# Patient Record
Sex: Male | Born: 1960 | Race: White | Hispanic: No | Marital: Single | Smoking: Current every day smoker
Health system: Southern US, Community
[De-identification: ages and names within clinical notes are randomized; demographics above are authoritative.]

## PROBLEM LIST (undated history)

## (undated) DIAGNOSIS — T4145XA Adverse effect of unspecified anesthetic, initial encounter: Secondary | ICD-10-CM

## (undated) DIAGNOSIS — T8859XA Other complications of anesthesia, initial encounter: Secondary | ICD-10-CM

## (undated) DIAGNOSIS — I10 Essential (primary) hypertension: Secondary | ICD-10-CM

## (undated) DIAGNOSIS — I8390 Asymptomatic varicose veins of unspecified lower extremity: Secondary | ICD-10-CM

## (undated) DIAGNOSIS — K219 Gastro-esophageal reflux disease without esophagitis: Secondary | ICD-10-CM

## (undated) DIAGNOSIS — G894 Chronic pain syndrome: Secondary | ICD-10-CM

## (undated) DIAGNOSIS — F410 Panic disorder [episodic paroxysmal anxiety] without agoraphobia: Secondary | ICD-10-CM

## (undated) DIAGNOSIS — K760 Fatty (change of) liver, not elsewhere classified: Secondary | ICD-10-CM

## (undated) HISTORY — PX: BACK SURGERY: SHX140

## (undated) HISTORY — PX: HARVEST BONE GRAFT: SHX377

## (undated) HISTORY — PX: FRACTURE SURGERY: SHX138

## (undated) HISTORY — PX: NASAL SINUS SURGERY: SHX719

---

## 1998-12-23 HISTORY — PX: MANDIBLE FRACTURE SURGERY: SHX706

## 2004-07-29 ENCOUNTER — Other Ambulatory Visit: Payer: Self-pay

## 2005-01-04 ENCOUNTER — Emergency Department: Payer: Self-pay | Admitting: Internal Medicine

## 2005-01-24 ENCOUNTER — Emergency Department: Payer: Self-pay | Admitting: Emergency Medicine

## 2005-09-22 ENCOUNTER — Emergency Department: Payer: Self-pay | Admitting: Emergency Medicine

## 2005-09-22 ENCOUNTER — Other Ambulatory Visit: Payer: Self-pay

## 2009-02-14 ENCOUNTER — Ambulatory Visit: Payer: Self-pay | Admitting: Internal Medicine

## 2009-02-16 ENCOUNTER — Observation Stay: Payer: Self-pay | Admitting: Otolaryngology

## 2009-09-01 ENCOUNTER — Emergency Department: Payer: Self-pay | Admitting: Emergency Medicine

## 2010-06-27 ENCOUNTER — Emergency Department: Payer: Self-pay | Admitting: Emergency Medicine

## 2010-07-13 ENCOUNTER — Ambulatory Visit: Payer: Self-pay | Admitting: Internal Medicine

## 2010-09-19 ENCOUNTER — Emergency Department: Payer: Self-pay | Admitting: Emergency Medicine

## 2010-10-08 ENCOUNTER — Inpatient Hospital Stay: Payer: Self-pay | Admitting: Internal Medicine

## 2010-12-19 ENCOUNTER — Emergency Department: Payer: Self-pay | Admitting: Unknown Physician Specialty

## 2011-05-29 ENCOUNTER — Emergency Department: Payer: Self-pay | Admitting: Internal Medicine

## 2011-08-23 ENCOUNTER — Emergency Department: Payer: Self-pay | Admitting: Emergency Medicine

## 2012-03-30 ENCOUNTER — Emergency Department: Payer: Self-pay | Admitting: Emergency Medicine

## 2012-04-02 LAB — WOUND AEROBIC CULTURE

## 2012-08-17 ENCOUNTER — Emergency Department: Payer: Self-pay | Admitting: Emergency Medicine

## 2012-08-17 LAB — COMPREHENSIVE METABOLIC PANEL
Anion Gap: 4 — ABNORMAL LOW (ref 7–16)
Calcium, Total: 9.4 mg/dL (ref 8.5–10.1)
Chloride: 110 mmol/L — ABNORMAL HIGH (ref 98–107)
Co2: 28 mmol/L (ref 21–32)
EGFR (African American): >60
EGFR (Non-African Amer.): >60
Osmolality: 280 (ref 275–301)
Potassium: 4.2 mmol/L (ref 3.5–5.1)
SGOT(AST): 47 U/L — ABNORMAL HIGH (ref 15–37)
SGPT (ALT): 51 U/L (ref 12–78)
Total Protein: 8 g/dL (ref 6.4–8.2)

## 2012-08-17 LAB — URINALYSIS, COMPLETE
Bilirubin,UR: NEGATIVE
Blood: NEGATIVE
Ketone: NEGATIVE
Nitrite: NEGATIVE
Ph: 5 (ref 4.5–8.0)
Squamous Epithelial: NONE SEEN

## 2012-08-17 LAB — CBC
MCH: 32.4 pg (ref 26.0–34.0)
MCV: 93 fL (ref 80–100)
Platelet: 232 10*3/uL (ref 150–440)
RDW: 12.9 % (ref 11.5–14.5)
WBC: 9.6 10*3/uL (ref 3.8–10.6)

## 2013-01-15 ENCOUNTER — Ambulatory Visit: Payer: Self-pay | Admitting: Neurology

## 2013-04-03 IMAGING — CT CT MAXILLOFACIAL WITH CONTRAST
1 series · 15 of 30 positions shown, 19 images · non-contrast
Comparison: none

REASON FOR EXAM: r/o abscess
COMMENTS:

[Series 3: soft tissue · axial · 0.34mm/px · z∈[-182,-42]mm · 15 of 51 slices shown, 19 images]
[im 2/51  brain]
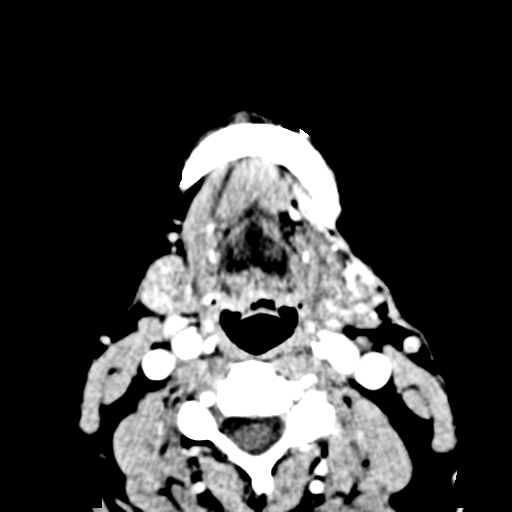
[im 2/51  bone]
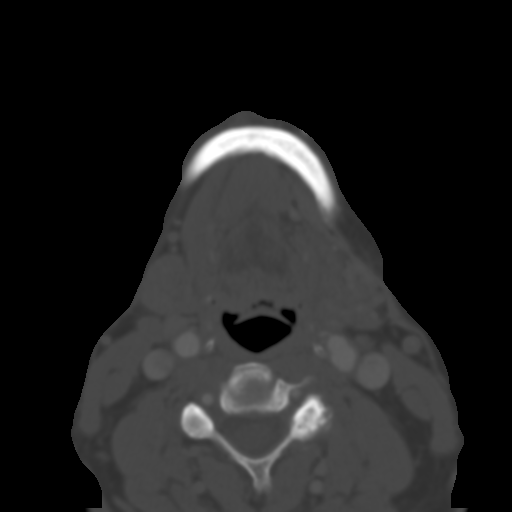
[im 6/51  bone]
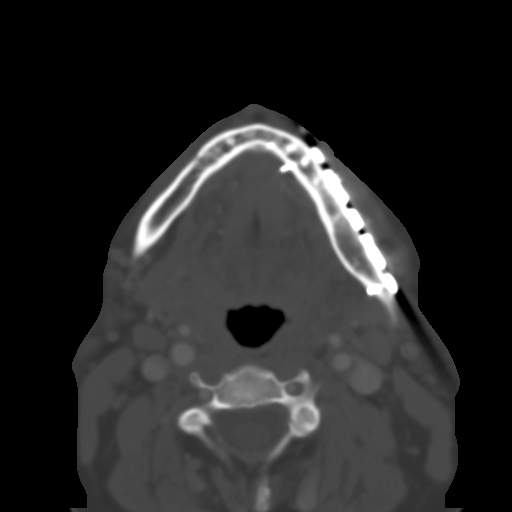
[im 9/51  bone]
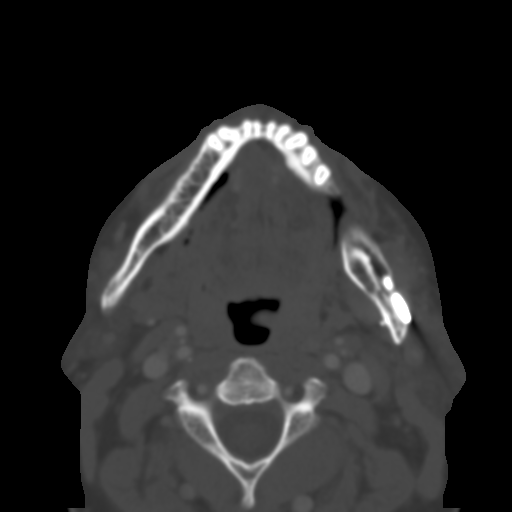
[im 13/51  bone]
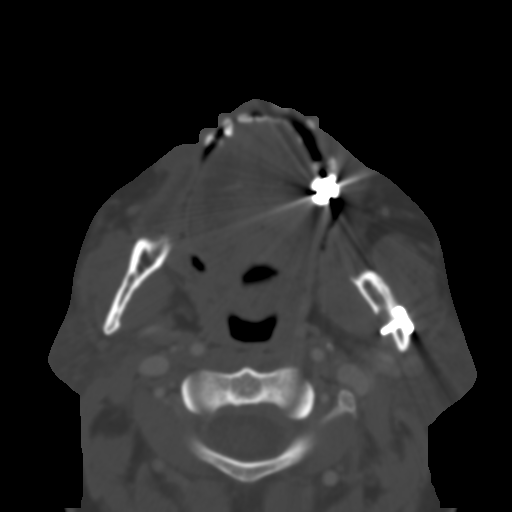
[im 16/51  brain]
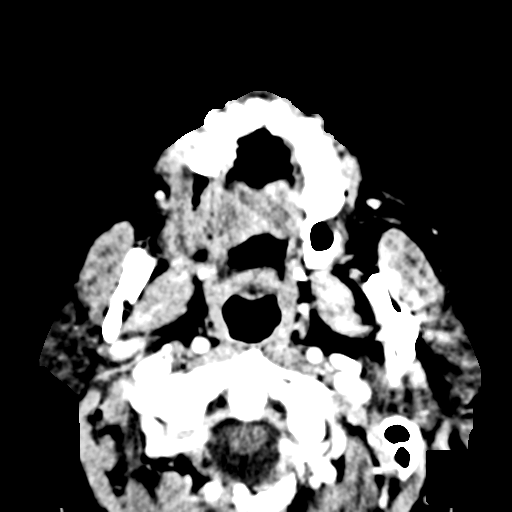
[im 16/51  bone]
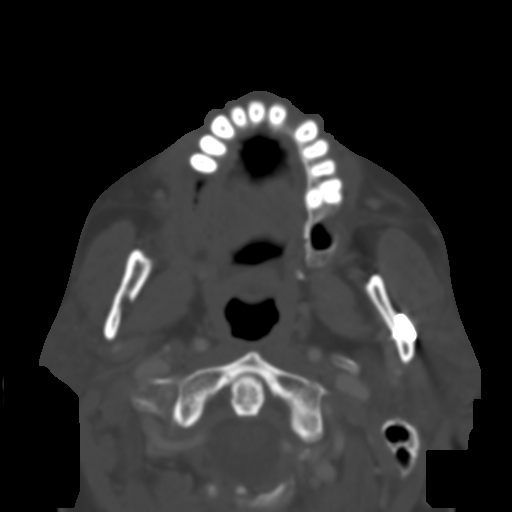
[im 19/51  bone]
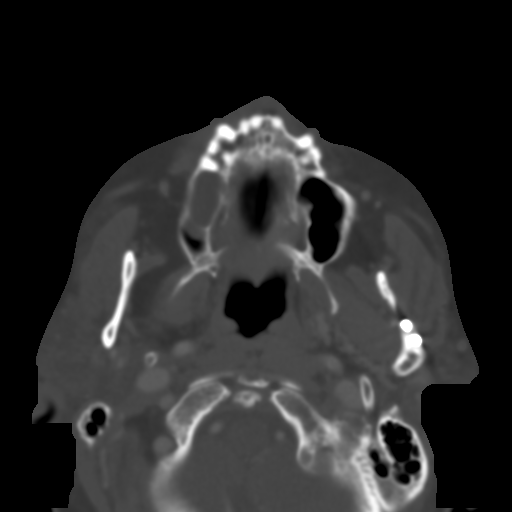
[im 23/51  bone]
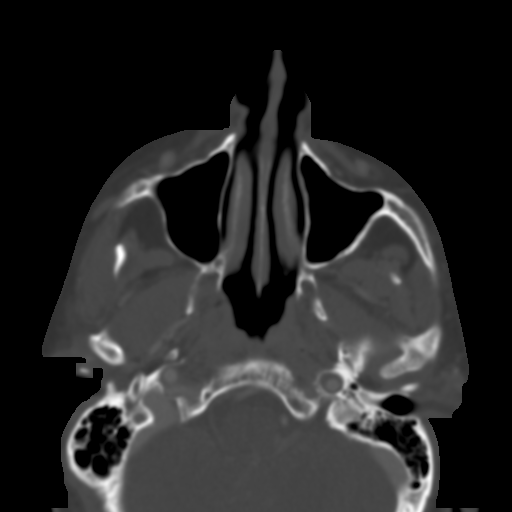
[im 26/51  bone]
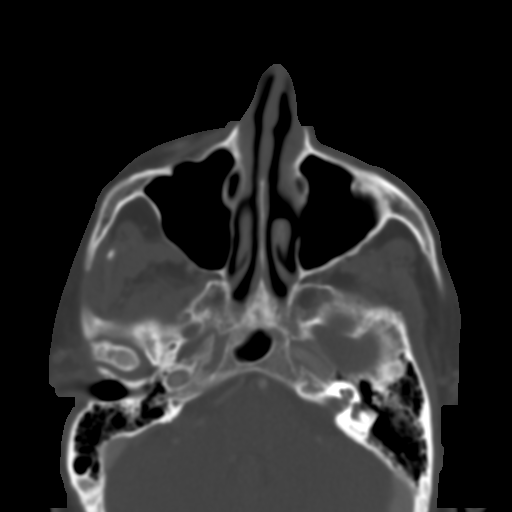
[im 28/51  brain]
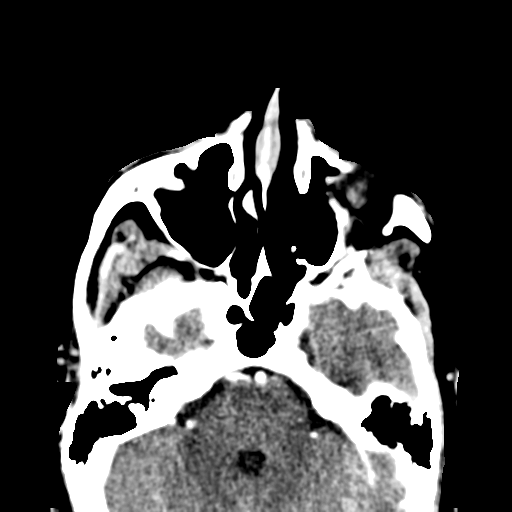
[im 28/51  bone]
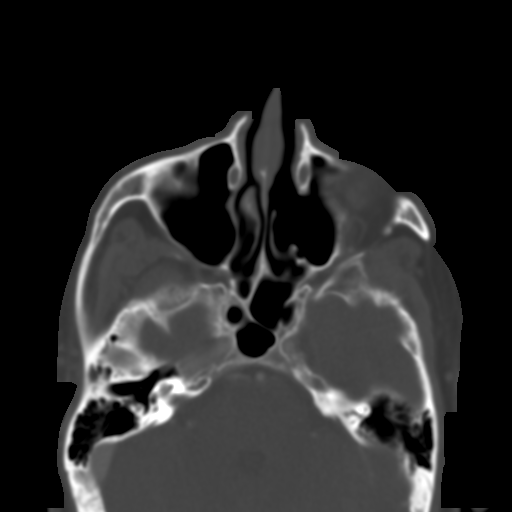
[im 32/51  bone]
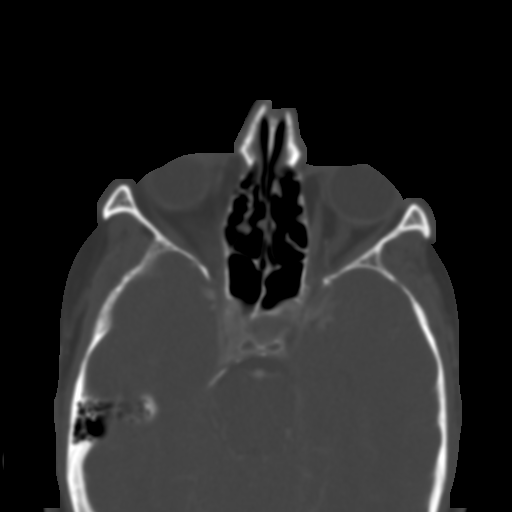
[im 35/51  bone]
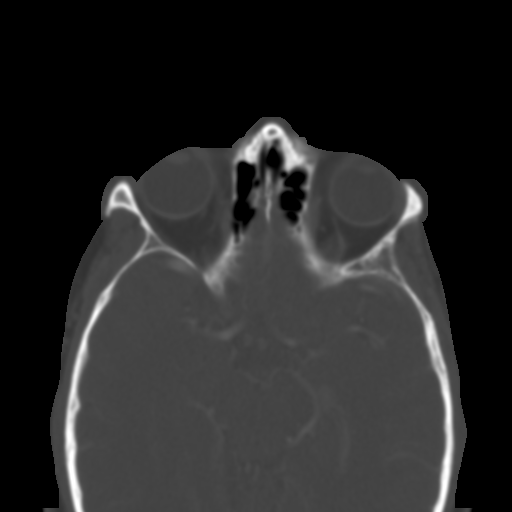
[im 38/51  bone]
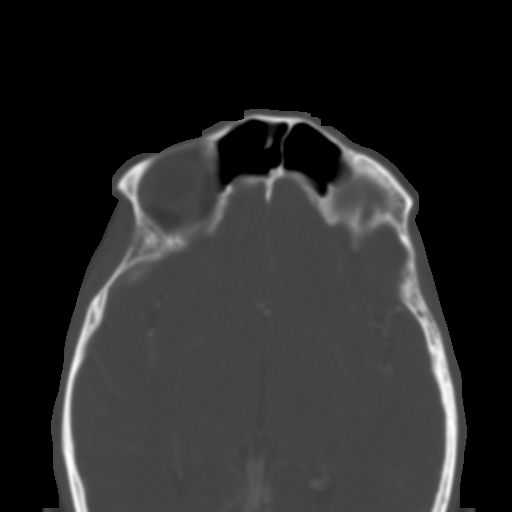
[im 42/51  brain]
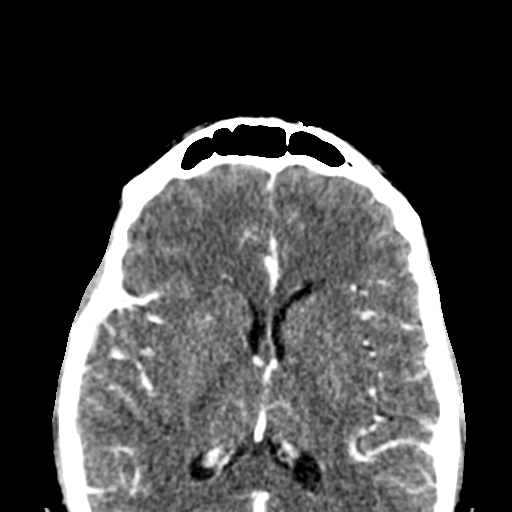
[im 42/51  bone]
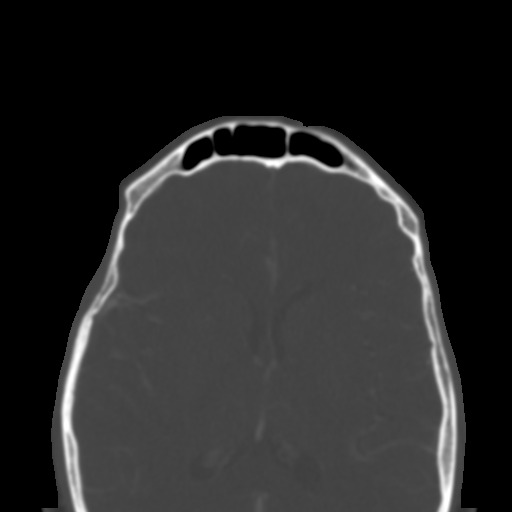
[im 45/51  bone]
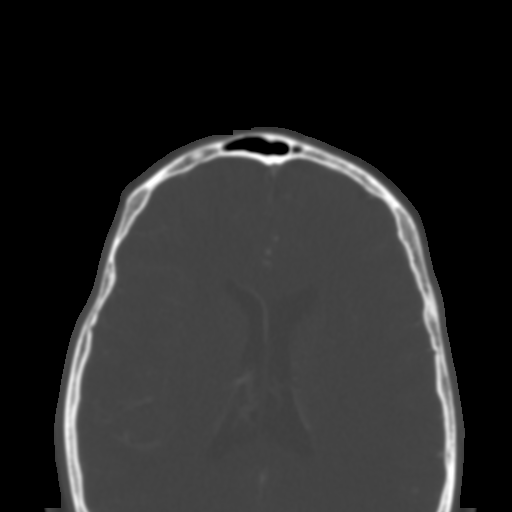
[im 49/51  bone]
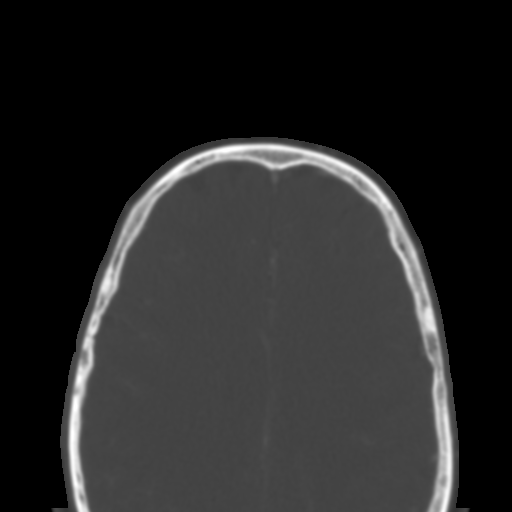

[15 of 30 positions shown; findings below may reference images not displayed]

PROCEDURE:     CT  - CT MAXILLOFACIAL AREA W  - August 23, 2011  [DATE]

RESULT:     Maxillofacial CT is performed with a multislice helical
acquisition reconstructed at 3.0 mm slice thickness in the axial and coronal
planes are at bone window settings and 2.0 mm slice thickness in both planes
at soft tissue window settings. The patient has no previous exam for
comparison.

A there is a metallic plate placed laterally in the left mid mandibular area
with multiple screws holding it in place. The orbital structures appear
unremarkable. No abscess or bony destruction is evident. No adenopathy is
demonstrated. The salivary glands appear grossly unremarkable. The
submandibular glands are incompletely included. The sinuses show defect
possibly surgical along the medial left maxillary wall. There is a mucous
retention cyst in the floor the right maxillary sinus the sinuses are
otherwise clear. The visualized calvarium and remaining facial bones appear
unremarkable. The included mastoids appear unremarkable.
IMPRESSION: No abscess evident. There was not significant submandibular
tissue included with the exam. Artifact from the plate and screws limit the
exam at the level of the plate.

## 2013-09-27 ENCOUNTER — Emergency Department: Payer: Self-pay | Admitting: Emergency Medicine

## 2013-10-01 ENCOUNTER — Emergency Department: Payer: Self-pay | Admitting: Emergency Medicine

## 2013-10-01 LAB — CBC
MCH: 31.6 pg (ref 26.0–34.0)
MCHC: 33.7 g/dL (ref 32.0–36.0)
MCV: 94 fL (ref 80–100)
Platelet: 248 10*3/uL (ref 150–440)
RBC: 4.98 10*6/uL (ref 4.40–5.90)
RDW: 13.1 % (ref 11.5–14.5)
WBC: 10.1 10*3/uL (ref 3.8–10.6)

## 2013-10-01 LAB — BASIC METABOLIC PANEL
BUN: 4 mg/dL — ABNORMAL LOW (ref 7–18)
Calcium, Total: 8.7 mg/dL (ref 8.5–10.1)
Creatinine: 0.82 mg/dL (ref 0.60–1.30)
EGFR (African American): >60
EGFR (Non-African Amer.): >60
Glucose: 116 mg/dL — ABNORMAL HIGH (ref 65–99)
Osmolality: 275 (ref 275–301)
Sodium: 139 mmol/L (ref 136–145)

## 2013-10-01 LAB — TROPONIN I: Troponin-I: <0.02 ng/mL

## 2013-10-02 LAB — URINALYSIS, COMPLETE
Bacteria: NONE SEEN
Bilirubin,UR: NEGATIVE
Ketone: NEGATIVE
Leukocyte Esterase: NEGATIVE
Nitrite: NEGATIVE
Ph: 7 (ref 4.5–8.0)
Protein: 30
RBC,UR: 1 /HPF (ref 0–5)
Specific Gravity: 1.015 (ref 1.003–1.030)
WBC UR: 1 /HPF (ref 0–5)

## 2013-10-02 LAB — TROPONIN I: Troponin-I: <0.02 ng/mL

## 2013-10-03 ENCOUNTER — Emergency Department: Payer: Self-pay | Admitting: Emergency Medicine

## 2013-10-03 LAB — CBC
MCHC: 34.2 g/dL (ref 32.0–36.0)
RDW: 13 % (ref 11.5–14.5)
WBC: 8.5 10*3/uL (ref 3.8–10.6)

## 2013-10-03 LAB — BASIC METABOLIC PANEL
Anion Gap: 6 — ABNORMAL LOW (ref 7–16)
Calcium, Total: 8.9 mg/dL (ref 8.5–10.1)
Co2: 28 mmol/L (ref 21–32)
Creatinine: 0.9 mg/dL (ref 0.60–1.30)
EGFR (Non-African Amer.): >60
Glucose: 126 mg/dL — ABNORMAL HIGH (ref 65–99)
Osmolality: 274 (ref 275–301)
Potassium: 3.9 mmol/L (ref 3.5–5.1)

## 2013-10-03 LAB — TROPONIN I: Troponin-I: <0.02 ng/mL

## 2014-04-01 ENCOUNTER — Emergency Department: Payer: Self-pay | Admitting: Emergency Medicine

## 2014-04-01 LAB — BASIC METABOLIC PANEL
ANION GAP: 6 — AB (ref 7–16)
BUN: 5 mg/dL — ABNORMAL LOW (ref 7–18)
CALCIUM: 8 mg/dL — AB (ref 8.5–10.1)
CHLORIDE: 108 mmol/L — AB (ref 98–107)
CO2: 25 mmol/L (ref 21–32)
Creatinine: 0.67 mg/dL (ref 0.60–1.30)
EGFR (Non-African Amer.): >60
GLUCOSE: 155 mg/dL — AB (ref 65–99)
Osmolality: 278 (ref 275–301)
Potassium: 3.2 mmol/L — ABNORMAL LOW (ref 3.5–5.1)
Sodium: 139 mmol/L (ref 136–145)

## 2014-04-01 LAB — CBC WITH DIFFERENTIAL/PLATELET
Basophil #: 0.2 10*3/uL — ABNORMAL HIGH (ref 0.0–0.1)
Basophil %: 3.2 %
Eosinophil #: 0.2 10*3/uL (ref 0.0–0.7)
Eosinophil %: 4.1 %
HCT: 49.9 % (ref 40.0–52.0)
HGB: 16.4 g/dL (ref 13.0–18.0)
LYMPHS ABS: 1.6 10*3/uL (ref 1.0–3.6)
Lymphocyte %: 31.7 %
MCH: 31.3 pg (ref 26.0–34.0)
MCHC: 32.9 g/dL (ref 32.0–36.0)
MCV: 95 fL (ref 80–100)
Monocyte #: 0.5 x10 3/mm (ref 0.2–1.0)
Monocyte %: 9.8 %
Neutrophil #: 2.7 10*3/uL (ref 1.4–6.5)
Neutrophil %: 51.2 %
Platelet: 228 10*3/uL (ref 150–440)
RBC: 5.25 10*6/uL (ref 4.40–5.90)
RDW: 13.6 % (ref 11.5–14.5)
WBC: 5.2 10*3/uL (ref 3.8–10.6)

## 2014-04-01 LAB — TROPONIN I: Troponin-I: <0.02 ng/mL

## 2014-04-02 LAB — URINALYSIS, COMPLETE
BLOOD: NEGATIVE
Bacteria: NONE SEEN
Bilirubin,UR: NEGATIVE
Glucose,UR: NEGATIVE mg/dL (ref 0–75)
KETONE: NEGATIVE
Leukocyte Esterase: NEGATIVE
Nitrite: NEGATIVE
PH: 5 (ref 4.5–8.0)
PROTEIN: NEGATIVE
RBC,UR: 1 /HPF (ref 0–5)
SPECIFIC GRAVITY: 1.02 (ref 1.003–1.030)
Squamous Epithelial: 1

## 2014-04-02 LAB — ETHANOL
ETHANOL %: 0.115 % — AB (ref 0.000–0.080)
Ethanol: 115 mg/dL

## 2014-06-20 ENCOUNTER — Emergency Department: Payer: Self-pay | Admitting: Emergency Medicine

## 2014-06-20 LAB — CBC
HCT: 50.3 % (ref 40.0–52.0)
HGB: 16.6 g/dL (ref 13.0–18.0)
MCH: 30.2 pg (ref 26.0–34.0)
MCHC: 33.1 g/dL (ref 32.0–36.0)
MCV: 91 fL (ref 80–100)
Platelet: 248 10*3/uL (ref 150–440)
RBC: 5.51 10*6/uL (ref 4.40–5.90)
RDW: 12.9 % (ref 11.5–14.5)
WBC: 9.1 10*3/uL (ref 3.8–10.6)

## 2014-06-20 LAB — DRUG SCREEN, URINE

## 2014-06-20 LAB — BASIC METABOLIC PANEL
Anion Gap: 11 (ref 7–16)
BUN: 6 mg/dL — ABNORMAL LOW (ref 7–18)
Calcium, Total: 9 mg/dL (ref 8.5–10.1)
Chloride: 98 mmol/L (ref 98–107)
Co2: 29 mmol/L (ref 21–32)
Creatinine: 1.15 mg/dL (ref 0.60–1.30)
EGFR (Non-African Amer.): >60
Glucose: 137 mg/dL — ABNORMAL HIGH (ref 65–99)
Osmolality: 275 (ref 275–301)
POTASSIUM: 3.7 mmol/L (ref 3.5–5.1)
Sodium: 138 mmol/L (ref 136–145)

## 2014-06-20 LAB — TROPONIN I

## 2014-06-20 LAB — D-DIMER(ARMC): D-Dimer: 793 ng/ml

## 2014-06-21 LAB — ETHANOL
Ethanol %: <0.003 % (ref 0.000–0.080)
Ethanol: <3 mg/dL

## 2014-06-23 LAB — CBC
HCT: 49 % (ref 40.0–52.0)
HGB: 15.8 g/dL (ref 13.0–18.0)
MCH: 29.7 pg (ref 26.0–34.0)
MCHC: 32.3 g/dL (ref 32.0–36.0)
MCV: 92 fL (ref 80–100)
PLATELETS: 219 10*3/uL (ref 150–440)
RBC: 5.32 10*6/uL (ref 4.40–5.90)
RDW: 13.3 % (ref 11.5–14.5)
WBC: 10.7 10*3/uL — ABNORMAL HIGH (ref 3.8–10.6)

## 2014-06-23 LAB — DRUG SCREEN, URINE
Amphetamines, Ur Screen: NEGATIVE (ref ?–1000)
BARBITURATES, UR SCREEN: NEGATIVE (ref ?–200)
BENZODIAZEPINE, UR SCRN: NEGATIVE (ref ?–200)
COCAINE METABOLITE, UR ~~LOC~~: POSITIVE (ref ?–300)
Cannabinoid 50 Ng, Ur ~~LOC~~: NEGATIVE (ref ?–50)
MDMA (ECSTASY) UR SCREEN: NEGATIVE (ref ?–500)
Methadone, Ur Screen: NEGATIVE (ref ?–300)
OPIATE, UR SCREEN: NEGATIVE (ref ?–300)
Phencyclidine (PCP) Ur S: NEGATIVE (ref ?–25)
Tricyclic, Ur Screen: NEGATIVE (ref ?–1000)

## 2014-06-23 LAB — COMPREHENSIVE METABOLIC PANEL
ALBUMIN: 3.4 g/dL (ref 3.4–5.0)
ALT: 62 U/L (ref 12–78)
AST: 43 U/L — AB (ref 15–37)
Alkaline Phosphatase: 193 U/L — ABNORMAL HIGH
Anion Gap: 7 (ref 7–16)
BUN: 9 mg/dL (ref 7–18)
Bilirubin,Total: 0.5 mg/dL (ref 0.2–1.0)
CALCIUM: 9 mg/dL (ref 8.5–10.1)
CO2: 29 mmol/L (ref 21–32)
Chloride: 102 mmol/L (ref 98–107)
Creatinine: 1.19 mg/dL (ref 0.60–1.30)
EGFR (Non-African Amer.): >60
Glucose: 177 mg/dL — ABNORMAL HIGH (ref 65–99)
Osmolality: 279 (ref 275–301)
Potassium: 3.5 mmol/L (ref 3.5–5.1)
SODIUM: 138 mmol/L (ref 136–145)
TOTAL PROTEIN: 7.2 g/dL (ref 6.4–8.2)

## 2014-06-23 LAB — URINALYSIS, COMPLETE
BLOOD: NEGATIVE
Bacteria: NONE SEEN
Bilirubin,UR: NEGATIVE
GLUCOSE, UR: NEGATIVE mg/dL (ref 0–75)
KETONE: NEGATIVE
LEUKOCYTE ESTERASE: NEGATIVE
Nitrite: NEGATIVE
PH: 6 (ref 4.5–8.0)
Protein: NEGATIVE
Specific Gravity: 1.014 (ref 1.003–1.030)
Squamous Epithelial: <1
WBC UR: 2 /HPF (ref 0–5)

## 2014-06-23 LAB — ETHANOL

## 2014-06-23 LAB — TROPONIN I

## 2014-06-24 ENCOUNTER — Inpatient Hospital Stay: Payer: Self-pay | Admitting: Psychiatry

## 2014-08-01 ENCOUNTER — Inpatient Hospital Stay: Payer: Self-pay | Admitting: Psychiatry

## 2014-08-01 LAB — CBC
HCT: 48.3 % (ref 40.0–52.0)
HGB: 15.7 g/dL (ref 13.0–18.0)
MCH: 30.4 pg (ref 26.0–34.0)
MCHC: 32.5 g/dL (ref 32.0–36.0)
MCV: 93 fL (ref 80–100)
Platelet: 173 10*3/uL (ref 150–440)
RBC: 5.18 10*6/uL (ref 4.40–5.90)
RDW: 13.9 % (ref 11.5–14.5)
WBC: 8.4 10*3/uL (ref 3.8–10.6)

## 2014-08-01 LAB — URINALYSIS, COMPLETE
BILIRUBIN, UR: NEGATIVE
Bacteria: NONE SEEN
Blood: NEGATIVE
GLUCOSE, UR: NEGATIVE mg/dL (ref 0–75)
KETONE: NEGATIVE
LEUKOCYTE ESTERASE: NEGATIVE
Nitrite: NEGATIVE
PROTEIN: NEGATIVE
Ph: 6 (ref 4.5–8.0)
RBC,UR: NONE SEEN /HPF (ref 0–5)
SPECIFIC GRAVITY: 1.001 (ref 1.003–1.030)
Squamous Epithelial: NONE SEEN
WBC UR: NONE SEEN /HPF (ref 0–5)

## 2014-08-01 LAB — SALICYLATE LEVEL: SALICYLATES, SERUM: 3.9 mg/dL — AB

## 2014-08-01 LAB — DRUG SCREEN, URINE
Amphetamines, Ur Screen: NEGATIVE (ref ?–1000)
BENZODIAZEPINE, UR SCRN: NEGATIVE (ref ?–200)
Barbiturates, Ur Screen: NEGATIVE (ref ?–200)
COCAINE METABOLITE, UR ~~LOC~~: NEGATIVE (ref ?–300)
Cannabinoid 50 Ng, Ur ~~LOC~~: NEGATIVE (ref ?–50)
MDMA (ECSTASY) UR SCREEN: NEGATIVE (ref ?–500)
Methadone, Ur Screen: NEGATIVE (ref ?–300)
OPIATE, UR SCREEN: NEGATIVE (ref ?–300)
Phencyclidine (PCP) Ur S: NEGATIVE (ref ?–25)
Tricyclic, Ur Screen: NEGATIVE (ref ?–1000)

## 2014-08-01 LAB — COMPREHENSIVE METABOLIC PANEL
Albumin: 3.4 g/dL (ref 3.4–5.0)
Alkaline Phosphatase: 126 U/L — ABNORMAL HIGH
Anion Gap: 12 (ref 7–16)
BUN: 6 mg/dL — ABNORMAL LOW (ref 7–18)
Bilirubin,Total: 0.6 mg/dL (ref 0.2–1.0)
CALCIUM: 8.4 mg/dL — AB (ref 8.5–10.1)
CO2: 26 mmol/L (ref 21–32)
CREATININE: 0.74 mg/dL (ref 0.60–1.30)
Chloride: 101 mmol/L (ref 98–107)
EGFR (Non-African Amer.): >60
GLUCOSE: 100 mg/dL — AB (ref 65–99)
OSMOLALITY: 275 (ref 275–301)
Potassium: 3.1 mmol/L — ABNORMAL LOW (ref 3.5–5.1)
SGOT(AST): 39 U/L — ABNORMAL HIGH (ref 15–37)
SGPT (ALT): 39 U/L
Sodium: 139 mmol/L (ref 136–145)
TOTAL PROTEIN: 7.5 g/dL (ref 6.4–8.2)

## 2014-08-01 LAB — ETHANOL
ETHANOL LVL: 140 mg/dL
Ethanol %: 0.14 % — ABNORMAL HIGH (ref 0.000–0.080)

## 2014-08-01 LAB — ACETAMINOPHEN LEVEL
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
## 2015-04-15 NOTE — Consult Note (Signed)
PATIENT NAME:  Nathaneil CanaryORTER, Revin A MR#:  [id number] DATE OF BIRTH:  [birth year]  DATE OF CONSULTATION:  08/01/2014  REFERRING PHYSICIAN:    CONSULTING PHYSICIAN:  Audery AmelJohn T. Jisela Merlino, MD  CONSULTING PHYSICIAN:  Audery AmelJohn T Trayvon Trumbull, MD   IDENTIFYING INFORMATION AND REASON FOR CONSULT: A 54 year old gentleman brought himself to the Emergency Room.   CHIEF COMPLAINT: "I am depressed."   HISTORY OF PRESENT ILLNESS: Information obtained from the patient, and the chart. The patient states that he is back to feeling severely depressed, he was here last month, then was feeling better when he was discharged, and stayed feeling better for a couple of weeks, but then it felt like the Effexor was no longer helping. He has been off the Effexor for about a week, went back to drinking; drinking 12 to 18 beers a day. Mood feeling depressed all the time, feels sad all the time, cannot sleep, no energy; stopped going to work a few days ago because he just did not care anymore, started to have thoughts about killing himself; denies homicidal ideation; has had some very vague, auditory hallucinations, no visual hallucinations, denies that he has been abusing other drugs.   PAST PSYCHIATRIC HISTORY: Patient has a past history of being diagnosed with PTSD; [facility]. Over the years has been on a large number of antidepressants including Remeron, and several serotonin reuptake inhibitors that he can remember. The only one that he remembers as really being helpful was the Effexor; that he was recently taking at 150 mg a day. He reports, however, that his depression is sporadic, and will seem to go away for years before returning. Also that he gets a lot of irritability, and anger, and mood swings along with his depression; never been diagnosed with bipolar disorder, long history of alcohol abuse, possible delirium tremens, but no history of seizures.    FAMILY HISTORY: No known family history of mental illness.   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   PAST MEDICAL HISTORY: Has high blood pressure, and gastric reflux symptoms, says he has been compliant with his medicine.   SUBSTANCE ABUSE HISTORY: As noted above, long history of alcohol abuse, has been able to get some sobriety for extended periods of time, but then recently relapsed.   CURRENT MEDICATIONS:  1.  Amlodipine 5 mg a day.  2.  Trazodone 150 mg at night.  3.  Olmesartan 20 mg a day.  4.  Pantoprazole 40 mg twice a day.   ALLERGIES: CODEINE AND MORPHINE.   REVIEW OF SYSTEMS: Depressed mood, negative thinking, suicidal ideation, low energy, hopelessness, helplessness; feeling weak, and run down, some sick to his stomach, no other specific physical symptoms.   MENTAL STATUS EXAMINATION: Mildly disheveled gentleman, looks his stated age, cooperative with the interview; eye contact intermittent; psychomotor activity sluggish. Speech decreased in rate, and tone, and quiet. Affect blunted, and flat. Mood stated as depressed. Thoughts are lucid. No loosening of associations, or delusions; denies auditory, or visual hallucinations; positive suicidal ideation; no homicidal ideation; alert, and oriented x 4; memory is 3 out of 3 objects immediately, and at 2 minutes; intelligence normal.   VITAL SIGNS: Blood pressure most recently is 121/82, respirations 18, pulse 112, temperature 98.1.   LABORATORY RESULTS: The drug screen was negative. Chemistry panel: Low potassium 3.1,  low calcium 8.4, elevated alkaline phosphatase 126, AST elevated at 39, alcohol level on admission 140, CBC unremarkable.   ASSESSMENT: A 54 year old gentleman with major depression, alcohol dependence. Return of severe depression with suicidal ideation; feeling hopeless, and out of control, drinking  heavily, needs admission to the hospital for stabilization.   TREATMENT PLAN: Detox orders in place. Restart blood pressure, and gastric reflux medicine. Leave off the Effexor since he has not been taking it; suicide precautions in place.   DIAGNOSIS, PRINCIPAL AND PRIMARY:   AXIS I: Major depression, severe, recurrent.   SECONDARY DIAGNOSES:  AXIS I: Alcohol dependence.   AXIS II: Deferred.   AXIS III: High blood pressure, gastric reflux.   AXIS IV: Severe from illness.   AXIS V: Functioning at time of evaluation is 30.    ____________________________ Audery Amel, MD jtc:nt D: 08/01/2014 20:08:54 ET T: 08/01/2014 20:58:53 ET JOB#: [id number]  cc: Audery Amel, MD, <Dictator> Audery Amel MD ELECTRONICALLY SIGNED 09/02/2014 16:55

## 2015-08-08 ENCOUNTER — Emergency Department: Payer: Self-pay

## 2015-08-08 ENCOUNTER — Emergency Department
Admission: EM | Admit: 2015-08-08 | Discharge: 2015-08-08 | Disposition: A | Discharge status: Home / Self Care | Payer: Self-pay | Attending: Emergency Medicine | Admitting: Emergency Medicine

## 2015-08-08 ENCOUNTER — Other Ambulatory Visit: Payer: Self-pay

## 2015-08-08 DIAGNOSIS — T679XXA Effect of heat and light, unspecified, initial encounter: Secondary | ICD-10-CM

## 2015-08-08 DIAGNOSIS — Y92096 Garden or yard of other non-institutional residence as the place of occurrence of the external cause: Secondary | ICD-10-CM | POA: Insufficient documentation

## 2015-08-08 DIAGNOSIS — R55 Syncope and collapse: Secondary | ICD-10-CM | POA: Insufficient documentation

## 2015-08-08 DIAGNOSIS — E869 Volume depletion, unspecified: Secondary | ICD-10-CM | POA: Insufficient documentation

## 2015-08-08 DIAGNOSIS — Z720 Tobacco use: Secondary | ICD-10-CM | POA: Insufficient documentation

## 2015-08-08 DIAGNOSIS — Y9389 Activity, other specified: Secondary | ICD-10-CM | POA: Insufficient documentation

## 2015-08-08 DIAGNOSIS — R000 Tachycardia, unspecified: Secondary | ICD-10-CM | POA: Insufficient documentation

## 2015-08-08 DIAGNOSIS — I10 Essential (primary) hypertension: Secondary | ICD-10-CM | POA: Insufficient documentation

## 2015-08-08 DIAGNOSIS — X30XXXA Exposure to excessive natural heat, initial encounter: Secondary | ICD-10-CM | POA: Insufficient documentation

## 2015-08-08 DIAGNOSIS — Y998 Other external cause status: Secondary | ICD-10-CM | POA: Insufficient documentation

## 2015-08-08 DIAGNOSIS — T678XXA Other effects of heat and light, initial encounter: Secondary | ICD-10-CM | POA: Insufficient documentation

## 2015-08-08 HISTORY — DX: Essential (primary) hypertension: I10

## 2015-08-08 LAB — URINALYSIS COMPLETE WITH MICROSCOPIC (ARMC ONLY)
Bacteria, UA: NONE SEEN
Bilirubin Urine: NEGATIVE
Glucose, UA: NEGATIVE mg/dL
HGB URINE DIPSTICK: NEGATIVE
KETONES UR: NEGATIVE mg/dL
LEUKOCYTES UA: NEGATIVE
NITRITE: NEGATIVE
PH: 6 (ref 5.0–8.0)
PROTEIN: NEGATIVE mg/dL
SPECIFIC GRAVITY, URINE: 1.014 (ref 1.005–1.030)
Squamous Epithelial / LPF: NONE SEEN

## 2015-08-08 LAB — HEPATIC FUNCTION PANEL
ALT: 51 U/L (ref 17–63)
AST: 61 U/L — AB (ref 15–41)
Albumin: 4.1 g/dL (ref 3.5–5.0)
Alkaline Phosphatase: 94 U/L (ref 38–126)
Bilirubin, Direct: 0.1 mg/dL (ref 0.1–0.5)
Indirect Bilirubin: 0.8 mg/dL (ref 0.3–0.9)
TOTAL PROTEIN: 7.1 g/dL (ref 6.5–8.1)
Total Bilirubin: 0.9 mg/dL (ref 0.3–1.2)

## 2015-08-08 LAB — BASIC METABOLIC PANEL
ANION GAP: 9 (ref 5–15)
BUN: 6 mg/dL (ref 6–20)
CALCIUM: 9.2 mg/dL (ref 8.9–10.3)
CO2: 31 mmol/L (ref 22–32)
Chloride: 99 mmol/L — ABNORMAL LOW (ref 101–111)
Creatinine, Ser: 0.81 mg/dL (ref 0.61–1.24)
Glucose, Bld: 108 mg/dL — ABNORMAL HIGH (ref 65–99)
POTASSIUM: 3.8 mmol/L (ref 3.5–5.1)
Sodium: 139 mmol/L (ref 135–145)

## 2015-08-08 LAB — CBC
HEMATOCRIT: 49.4 % (ref 40.0–52.0)
HEMOGLOBIN: 16.4 g/dL (ref 13.0–18.0)
MCH: 31.5 pg (ref 26.0–34.0)
MCHC: 33.3 g/dL (ref 32.0–36.0)
MCV: 94.5 fL (ref 80.0–100.0)
Platelets: 219 10*3/uL (ref 150–440)
RBC: 5.22 MIL/uL (ref 4.40–5.90)
RDW: 12.9 % (ref 11.5–14.5)
WBC: 8.9 10*3/uL (ref 3.8–10.6)

## 2015-08-08 LAB — CK: Total CK: 120 U/L (ref 49–397)

## 2015-08-08 LAB — MAGNESIUM: Magnesium: 1.7 mg/dL (ref 1.7–2.4)

## 2015-08-08 LAB — TROPONIN I

## 2015-08-08 MED ORDER — SODIUM CHLORIDE 0.9 % IV BOLUS (SEPSIS)
1000.0000 mL | Freq: Once | INTRAVENOUS | Status: AC
  Administered 2015-08-08: 1000 mL via INTRAVENOUS

## 2015-08-08 MED ORDER — SODIUM CHLORIDE 0.9 % IV BOLUS (SEPSIS)
1000.0000 mL | INTRAVENOUS | Status: AC
  Administered 2015-08-08: 1000 mL via INTRAVENOUS

## 2015-08-08 NOTE — Discharge Instructions (Signed)
As we discussed, although you have no concerning lab values, we believe that your exposure to the heat and not drinking enough resulted in some dehydration and near collapse.  Fortunately your body is handling it well and there is no evidence of any damage.  We provided you have some IV fluids and you are able to eat and drink well and feel your appropriate for outpatient follow-up.  Please follow-up with your regular doctor or at the [facility] clinic urgent care in a couple of days as needed, or return to the emergency department if you develop any new or worsening symptoms that concern you.   Dehydration, Adult Dehydration means your body does not have as much fluid as it needs. Your kidneys, brain, and heart will not work properly without the right amount of fluids and salt.  HOME CARE  Ask your doctor how to replace body fluid losses (rehydrate).  Drink enough fluids to keep your pee (urine) clear or pale yellow.  Drink small amounts of fluids often if you feel sick to your stomach (nauseous) or throw up (vomit).  Eat like you normally do.  Avoid:  Foods or drinks high in sugar.  Bubbly (carbonated) drinks.  Juice.  Very hot or cold fluids.  Drinks with caffeine.  Fatty, greasy foods.  Alcohol.  Tobacco.  Eating too much.  Gelatin desserts.  Wash your hands to avoid spreading germs (bacteria, viruses).  Only take medicine as told by your doctor.  Keep all doctor visits as told. GET HELP RIGHT AWAY IF:   You cannot drink something without throwing up.  You get worse even with treatment.  Your vomit has blood in it or looks greenish.  Your poop (stool) has blood in it or looks black and tarry.  You have not peed in 6 to 8 hours.  You pee a small amount of very dark pee.  You have a fever.  You pass out (faint).  You have belly (abdominal) pain that gets worse or stays in one spot (localizes).  You have a rash, stiff neck, or bad headache.  You get  easily annoyed, sleepy, or are hard to wake up.  You feel weak, dizzy, or very thirsty. MAKE SURE YOU:   Understand these instructions.  Will watch your condition.  Will get help right away if you are not doing well or get worse. Document Released: 10/05/2009 Document Revised: 03/02/2012 Document Reviewed: 07/29/2011 [facility] Patient Information 2015 [place], [place]. This information is not intended to replace advice given to you by your health care provider. Make sure you discuss any questions you have with your health care provider.  Heat-Related Illness Heat-related illnesses occur when the body is unable to properly cool itself. The body normally cools itself by sweating. However, under some conditions sweating is not enough. In these cases, a person's body temperature rises rapidly. Very high body temperatures may damage the brain or other vital organs. Some examples of heat-related illnesses include:  Heat stroke. This occurs when the body is unable to regulate its temperature. The body's temperature rises rapidly, the sweating mechanism fails, and the body is unable to cool down. Body temperature may rise to 106 F (41 C) or higher within 10 to 15 minutes. Heat stroke can cause death or permanent disability if emergency treatment is not provided.  Heat exhaustion. This is a milder form of heat-related illness that can develop after several days of exposure to high temperatures and not enough fluids. It is the body's response  to an excessive loss of the water and salt contained in sweat.  Heat cramps. These usually affect people who sweat a lot during heavy activity. This sweating drains the body's salt and moisture. The low salt level in the muscles causes painful cramps. Heat cramps may also be a symptom of heat exhaustion. Heat cramps usually occur in the abdomen, arms, or legs. Get medical attention for cramps if you have heart problems or are on a low-sodium diet. Those that are at  greatest risk for heat-related illnesses include:   The elderly.  Infant and the very young.  People with mental illness and chronic diseases.  People who are overweight (obese).  Young and healthy people can even succumb to heat if they participate in strenuous physical activities during hot weather. CAUSES  Several factors affect the body's ability to cool itself during extremely hot weather. When the humidity is high, sweat will not evaporate as quickly. This prevents the body from releasing heat quickly. Other factors that can affect the body's ability to cool down include:   Age.  Obesity.  Fever.  Dehydration.  Heart disease.  Mental illness.  Poor circulation.  Sunburn.  Prescription drug use.  Alcohol use. SYMPTOMS  Heat stroke: Warning signs of heat stroke vary, but may include:  An extremely high body temperature (above 103F orally).  A fast, strong pulse.  Dizziness.  Confusion.  Red, hot, and dry skin.  No sweating.  Throbbing headache.  Feeling sick to your stomach (nauseous).  Unconsciousness. Heat exhaustion: Warning signs of heat exhaustion include:  Heavy sweating.  Tiredness.  Headache.  Paleness.  Weakness.  Feeling sick to your stomach (nauseous) or vomiting.  Muscle cramps. Heat cramps  Muscle pains or spasms. TREATMENT  Heat stroke  Get into a cool environment. An indoor place that is air-conditioned may be best.  Take a cool shower or bath. Have someone around to make sure you are okay.  Take your temperature. Make sure it is going down. Heat exhaustion  Drink plenty of fluids. Do not drink liquids that contain caffeine, alcohol, or large amounts of sugar. These cause you to lose more body fluid. Also, avoid very cold drinks. They can cause stomach cramps.  Get into a cool environment. An indoor place that is air-conditioned may be best.  Take a cool shower or bath. Have someone around to make sure you are  okay.  Put on lightweight clothing. Heat cramps  Stop whatever activity you were doing. Do not attempt to do that activity for at least 3 hours after the cramps have gone away.  Get into a cool environment. An indoor place that is air-conditioned may be best. HOME CARE INSTRUCTIONS  To protect your health when temperatures are extremely high, follow these tips:  During heavy exercise in a hot environment, drink two to four glasses (16-32 ounces) of cool fluids each hour. Do not wait until you are thirsty to drink. Warning: If your caregiver limits the amount of fluid you drink or has you on water pills, ask how much you should drink while the weather is hot.  Do not drink liquids that contain caffeine, alcohol, or large amounts of sugar. These cause you to lose more body fluid.  Avoid very cold drinks. They can cause stomach cramps.  Wear appropriate clothing. Choose lightweight, light-colored, loose-fitting clothing.  If you must be outdoors, try to limit your outdoor activity to morning and evening hours. Try to rest often in shady areas.  If  you are not used to working or exercising in a hot environment, start slowly and pick up the pace gradually.  Stay cool in an air-conditioned place if possible. If your home does not have air conditioning, go to the shopping mall or [employer].  Taking a cool shower or bath may help you cool off. SEEK MEDICAL CARE IF:   You see any of the symptoms listed above. You may be dealing with a life-threatening emergency.  Symptoms worsen or last longer than 1 hour.  Heat cramps do not get better in 1 hour. MAKE SURE YOU:   Understand these instructions.  Will watch your condition.  Will get help right away if you are not doing well or get worse. Document Released: 09/17/2008 Document Revised: 03/02/2012 Document Reviewed: 09/17/2008 [facility] Patient Information 2015 [place], [place]. This information is not intended to replace advice given  to you by your health care provider. Make sure you discuss any questions you have with your health care provider.  Rehydration, Adult Rehydration is the replacement of body fluids lost during dehydration. Dehydration is an extreme loss of body fluids to the point of body function impairment. There are many ways extreme fluid loss can occur, including vomiting, diarrhea, or excess sweating. Recovering from dehydration requires replacing lost fluids, continuing to eat to maintain strength, and avoiding foods and beverages that may contribute to further fluid loss or may increase nausea. HOW TO REHYDRATE In most cases, rehydration involves the replacement of not only fluids but also carbohydrates and basic body salts. Rehydration with an oral rehydration solution is one way to replace essential nutrients lost through dehydration. An oral rehydration solution can be purchased at pharmacies, retail stores, and online. Premixed packets of powder that you combine with water to make a solution are also sold. You can prepare an oral rehydration solution at home by mixing the following ingredients together:    - tsp table salt.   tsp baking soda.   tsp salt substitute containing potassium chloride.  1 tablespoons sugar.  1 L (34 oz) of water. Be sure to use exact measurements. Including too much sugar can make diarrhea worse. Drink -1 cup (120-240 mL) of oral rehydration solution each time you have diarrhea or vomit. If drinking this amount makes your vomiting worse, try drinking smaller amounts more often. For example, drink 1-3 tsp every 5-10 minutes.  A general rule for staying hydrated is to drink 1-2 L of fluid per day. Talk to your caregiver about the specific amount you should be drinking each day. Drink enough fluids to keep your urine clear or pale yellow. EATING WHEN DEHYDRATED Even if you have had severe sweating or you are having diarrhea, do not stop eating. Many healthy items in a normal  diet are okay to continue eating while recovering from dehydration. The following tips can help you to lessen nausea when you eat:  Ask someone else to prepare your food. Cooking smells may worsen nausea.  Eat in a well-ventilated room away from cooking smells.  Sit up when you eat. Avoid lying down until 1-2 hours after eating.  Eat small amounts when you eat.  Eat foods that are easy to digest. These include soft, well-cooked, or mashed foods. FOODS AND BEVERAGES TO AVOID Avoid eating or drinking the following foods and beverages that may increase nausea or further loss of fluid:   Fruit juices with a high sugar content, such as concentrated juices.  Alcohol.  Beverages containing caffeine.  Carbonated drinks.  They may cause a lot of gas.  Foods that may cause a lot of gas, such as cabbage, broccoli, and beans.  Fatty, greasy, and fried foods.  Spicy, very salty, and very sweet foods or drinks.  Foods or drinks that are very hot or very cold. Consume food or drinks at or near room temperature.  Foods that need a lot of chewing, such as raw vegetables.  Foods that are sticky or hard to swallow, such as peanut butter. Document Released: 03/02/2012 Document Revised: 09/02/2012 Document Reviewed: 03/02/2012 [facility] Patient Information 2015 [place], [place]. This information is not intended to replace advice given to you by your health care provider. Make sure you discuss any questions you have with your health care provider.  Syncope Syncope means a person passes out (faints). The person usually wakes up in less than 5 minutes. It is important to seek medical care for syncope. HOME CARE  Have someone stay with you until you feel normal.  Do not drive, use machines, or play sports until your doctor says it is okay.  Keep all doctor visits as told.  Lie down when you feel like you might pass out. Take deep breaths. Wait until you feel normal before standing up.  Drink  enough fluids to keep your pee (urine) clear or pale yellow.  If you take blood pressure or heart medicine, get up slowly. Take several minutes to sit and then stand. GET HELP RIGHT AWAY IF:   You have a severe headache.  You have pain in the chest, belly (abdomen), or back.  You are bleeding from the mouth or butt (rectum).  You have black or tarry poop (stool).  You have an irregular or very fast heartbeat.  You have pain with breathing.  You keep passing out, or you have shaking (seizures) when you pass out.  You pass out when sitting or lying down.  You feel confused.  You have trouble walking.  You have severe weakness.  You have vision problems. If you fainted, call your local emergency services (911 in [place] Do not drive yourself to the hospital. MAKE SURE YOU:   Understand these instructions.  Will watch your condition.  Will get help right away if you are not doing well or get worse. Document Released: 05/27/2008 Document Revised: 06/09/2012 Document Reviewed: 02/07/2012 [facility] Patient Information 2015 [place], [place]. This information is not intended to replace advice given to you by your health care provider. Make sure you discuss any questions you have with your health care provider.

## 2015-08-08 NOTE — ED Notes (Signed)
Pt reports this morning passing out. States he has been out in the heat for work and working in the yard, was drinking gatorade at work also. C/o chest pain as well as left sided abdominal pain.

## 2015-08-08 NOTE — ED Provider Notes (Signed)
[facility] Emergency Department Provider Note  ____________________________________________  Time seen: Approximately 2:03 PM  I have reviewed the triage vital signs and the nursing notes.   HISTORY  Chief Complaint Heat Exposure    HPI Andre Wagner is a 54 y.o. male with history of hypertension and COPD who presents after passing out this morning in the setting of working in the heat for several days.  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  It is been extremely hot this week, and after working all day yesterday he went home and worked outside in the heat in his yard.  He reports acute onset of nausea and vomiting and generalized weakness.  He denies chest pain, worse of breath, and abdominal pain.  He went inside and cool down and rested but was not able to eat or drink anything.  When he awoke this morning he had severe muscle cramps primarily in his legs but also some in his arms.  He tried to get up but then reports that he passed out suddenly and awoke on the floor.  He states that he was "in a fog" and just stayed "in a daze" in his house for a couple of hours before he started to "come to" and realized that he needed to come to the emergency department.  He currently feels better and is not having any muscle cramps and has mild tachycardia but no other symptoms at this time other than generalized weakness.  He no longer has any nausea or vomiting.He reports that he urinated once last night but did not urinate when he awoke this morning and has not needed to go to the bathroom today.  He has never had any symptoms like this in the past.   Past Medical History  Diagnosis Date  . Hypertension   . COPD (chronic obstructive pulmonary disease)     There are no active problems to display for this patient.   Past Surgical History  Procedure Laterality Date  . Fracture surgery    . Back surgery    . Facial fracture  surgery      No current outpatient prescriptions on file.  Allergies Review of patient's allergies indicates no known allergies.  No family history on file.  Social History Social History  Substance Use Topics  . Smoking status: Current Every Day Smoker    Types: Cigarettes  . Smokeless tobacco: None  . Alcohol Use: Yes     Comment: occassionally    Review of Systems Constitutional: No fever/chills Eyes: No visual changes. ENT: No sore throat. Cardiovascular: Denies chest pain.  Passed out once earlier today. Respiratory: Denies shortness of breath. Gastrointestinal: No abdominal pain.  Nausea and vomiting last night.  No diarrhea.  No constipation. Genitourinary: Negative for dysuria. Musculoskeletal: Negative for back pain.  Severe muscle cramps in his extremities earlier today which since resolved Skin: Negative for rash. Neurological: Negative for headaches, focal weakness or numbness.  Generalized weakness.  10-point ROS otherwise negative.  ____________________________________________   PHYSICAL EXAM:  VITAL SIGNS: ED Triage Vitals  Enc Vitals Group     BP 08/08/15 1137 153/110 mmHg     Pulse Rate 08/08/15 1135 103     Resp 08/08/15 1135 18     Temp 08/08/15 1135 98.3 F (36.8 C)     Temp Source 08/08/15 1135 Oral     SpO2 08/08/15 1135 95 %     Weight 08/08/15 1135 170 lb (  77.111 kg)     Height 08/08/15 1135 5\' 6"  (1.676 m)     Head Cir --      Peak Flow --      Pain Score 08/08/15 1136 6     Pain Loc --      Pain Edu? --      Excl. in GC? --     Constitutional: Alert and oriented. Well appearing and in no acute distress. Eyes: Conjunctivae are normal. PERRL. EOMI. Head: Atraumatic. Nose: No congestion/rhinnorhea. Mouth/Throat: Mucous membranes are tacky.  Oropharynx non-erythematous. Neck: No stridor.   Cardiovascular: Mild tachycardia, regular rhythm. Grossly normal heart sounds.  Good peripheral circulation. Respiratory: Normal respiratory  effort.  No retractions. Lungs CTAB. Gastrointestinal: Soft and nontender. No distention. No abdominal bruits. No CVA tenderness. Musculoskeletal: No lower extremity tenderness nor edema.  No joint effusions. Neurologic:  Normal speech and language. No gross focal neurologic deficits are appreciated.  Skin:  Skin is warm, dry and intact. No rash noted. Psychiatric: Mood and affect are normal. Speech and behavior are normal.  ____________________________________________   LABS (all labs ordered are listed, but only abnormal results are displayed)  Labs Reviewed  BASIC METABOLIC PANEL - Abnormal; Notable for the following:    Chloride 99 (*)    Glucose, Bld 108 (*)    All other components within normal limits  HEPATIC FUNCTION PANEL - Abnormal; Notable for the following:    AST 61 (*)    All other components within normal limits  CBC  TROPONIN I  MAGNESIUM  CK  URINALYSIS COMPLETEWITH MICROSCOPIC ([facility] ONLY)   ____________________________________________  EKG  ED ECG REPORT I, Verna Desrocher, the attending physician, personally viewed and interpreted this ECG.  Date: 08/08/2015 EKG Time: 11:44 Rate: 105 Rhythm: Sinus tachycardia QRS Axis: normal Intervals: normal ST/T Wave abnormalities: normal Conduction Disutrbances: none Narrative Interpretation: unremarkable  ____________________________________________  RADIOLOGY I, Adrik Khim, personally viewed and evaluated these images (plain radiographs) as part of my medical decision making.   Dg Chest 2 View  08/08/2015   CLINICAL DATA:  Chest pain and dizziness for 1 day  EXAM: CHEST  2 VIEW  COMPARISON:  Chest radiograph June 20, 2014; CT angiogram chest June 20, 2014  FINDINGS: No edema or consolidation. Heart size and pulmonary vascularity are normal. No adenopathy. There is an old healed fracture of the right clavicle. There is degenerative change in the thoracic spine.  IMPRESSION: No edema or consolidation.    Electronically Signed   By: Bretta Bang III M.D.   On: 08/08/2015 13:02    ____________________________________________   PROCEDURES  Procedure(s) performed: None  Critical Care performed: No ____________________________________________   INITIAL IMPRESSION / ASSESSMENT AND PLAN / ED COURSE  Pertinent labs & imaging results that were available during my care of the patient were reviewed by me and considered in my medical decision making (see chart for details).  Based on the patient's history and symptoms, anticipated acute renal failure in the setting of dehydration and I am still concerned about rhabdomyolysis.  However, his labs, which were drawn in triage, are notable for an essentially normal basic metabolic panel with a creatinine 0.8, BUN of 6, and normal electrolytes.  His troponin I is less than 0.03.  I called the lab and asked them to add on a magnesium and a CK and I asked the patient to provide a urine sample.  I am giving him 1 L normal saline and we will continue to observe him.  Based on the patient's reassuring lab work thus far, he may simply have uncomplicated heat exposure and volume depletion and will benefit from IV fluids.  ----------------------------------------- 3:53 PM on 08/08/2015 -----------------------------------------  The patient is well-appearing and in no acute distress.  His lab results were reassuring with no indication of acute kidney injury nor of rhabdomyolysis nor of any electrolyte abnormalities.  He states he feels quite a bit better after 1 L.  He was able to provide some urine but it is quite dark.  Given the still feels a little bit weak and how long he was volume depleted, I am giving him another liter of fluids as well as a by mouth challenge.  I anticipate he will be well enough to go home and I gave him my usual and customary return precautions.  He understands and agrees with the plan.    ____________________________________________  FINAL CLINICAL IMPRESSION(S) / ED DIAGNOSES  Final diagnoses:  Syncope and collapse  Volume depletion  Heat exposure, initial encounter      NEW MEDICATIONS STARTED DURING THIS VISIT:  New Prescriptions   No medications on file     Loleta Rose, MD 08/08/15 1555

## 2015-08-08 NOTE — ED Notes (Signed)
Pt states he was working [employer] yesterday in the heat and after work he started having N/V, with chest pain and dizziness..states when he got up this morning he passed out with continued chest pain and other sx.Marland Kitchen

## 2015-11-25 ENCOUNTER — Ambulatory Visit (INDEPENDENT_AMBULATORY_CARE_PROVIDER_SITE_OTHER): Payer: Worker's Compensation

## 2015-11-25 ENCOUNTER — Ambulatory Visit
Admission: EM | Admit: 2015-11-25 | Discharge: 2015-11-25 | Disposition: A | Discharge status: Home / Self Care | Payer: Worker's Compensation | Attending: Family Medicine | Admitting: Family Medicine

## 2015-11-25 ENCOUNTER — Encounter: Payer: Self-pay | Admitting: Emergency Medicine

## 2015-11-25 DIAGNOSIS — M25512 Pain in left shoulder: Secondary | ICD-10-CM

## 2015-11-25 DIAGNOSIS — M545 Low back pain, unspecified: Secondary | ICD-10-CM

## 2015-11-25 DIAGNOSIS — W19XXXA Unspecified fall, initial encounter: Secondary | ICD-10-CM | POA: Diagnosis not present

## 2015-11-25 DIAGNOSIS — M25551 Pain in right hip: Secondary | ICD-10-CM

## 2015-11-25 DIAGNOSIS — S301XXA Contusion of abdominal wall, initial encounter: Secondary | ICD-10-CM

## 2015-11-25 MED ORDER — NAPROXEN 375 MG PO TABS: 375.0000 mg | ORAL_TABLET | Freq: Two times a day (BID) | ORAL | Status: DC

## 2015-11-25 NOTE — Discharge Instructions (Signed)
Follow up with your Worker's Compensation Department Return here as needed Follow up with your PCP for your blood pressure as it is elevated today & was at the last visit

## 2015-11-25 NOTE — ED Notes (Signed)
Right hip pain, left shoulder pain, low back pain for 4 days. Workers [employer]. Was moving metal rails to a flat bed truck, foot slipped in ditch and fell and rails fell on back.

## 2015-11-25 NOTE — ED Provider Notes (Signed)
CSN: [id number]     Arrival date & time 11/25/15  1105 History   First MD Initiated Contact with Patient 11/25/15 1203     Chief Complaint  Patient presents with  . Hip Pain  . Back Pain  . Shoulder Injury   HPI  Andre Wagner is a pleasant 54 y.o. male who presents with left anterior shoulder, right hip, & lumbar back pain.  This is a Work-related injury & he works for [employer].  He states 11/30 he was working trying to move heavy truck bedrails.  At that time, his left foot slipped into a ditch & the bedrails flew up & landed on his lower back.  He fell & landed in the ditch & had most of the impact to his left shoulder & elbow.  He states he has had good ROM but severe pain. He states he noticed bruising on bilateral hips as well as his abdomen. He denies abdominal pain or left elbow pain.   Pain 7/10 now & at worst.  Low back pain is at L4-5. Hx of diskectomy 12 years at L5-S1 but denies any chronic pain issues.  Pain radiates down right posterior leg.  He has tried Ibuprofen 800mg  BID without any relief for the past few days. Pain is worse with movement. No alleviating factors. No loss of consciousness. He has not been seen or evaluated prior to this visit today.  BP was elevated at last visit 08/08/15 153/110.  Past Medical History  Diagnosis Date  . Hypertension   . COPD (chronic obstructive pulmonary disease) [facility](HCC)    Past Surgical History  Procedure Laterality Date  . Fracture surgery    . Back surgery    . Facial fracture surgery     History reviewed. No pertinent family history. Social History  Substance Use Topics  . Smoking status: Current Every Day Smoker    Types: Cigarettes  . Smokeless tobacco: None  . Alcohol Use: Yes     Comment: occassionally    Review of Systems  Constitutional: Negative.   HENT: Negative.   Eyes: Negative.   Respiratory: Negative.   Cardiovascular: Negative.   Gastrointestinal: Negative.   Genitourinary: Negative.     Musculoskeletal: Positive for back pain and arthralgias.  Skin: Negative.   Neurological: Negative.   Psychiatric/Behavioral: Negative.     Allergies  Review of patient's allergies indicates no known allergies.  Home Medications   Prior to Admission medications   Medication Sig Start Date End Date Taking? Authorizing Provider  naproxen (NAPROSYN) 375 MG tablet Take 1 tablet (375 mg total) by mouth 2 (two) times daily. 11/25/15   Joselyn ArrowKandice L Nikiya Starn, NP   Meds Ordered and Administered this Visit  Medications - No data to display  BP 172/127 mmHg  Pulse 86  Temp(Src) 98.1 F (36.7 C) (Tympanic)  Resp 18  Ht 5\' 6"  (1.676 m)  Wt 165 lb (74.844 kg)  BMI 26.64 kg/m2  SpO2 99% No data found.   Physical Exam  Constitutional: He is oriented to person, place, and time. He appears well-developed and well-nourished. No distress.  HENT:  Head: Normocephalic and atraumatic.  Eyes: Conjunctivae are normal. No scleral icterus.  Neck: Normal range of motion.  Cardiovascular: Normal rate and regular rhythm.   Pulmonary/Chest: Effort normal and breath sounds normal. No respiratory distress.  Abdominal: Soft. Bowel sounds are normal. He exhibits no distension.  Musculoskeletal: Normal range of motion. He exhibits no edema.  Left shoulder: He exhibits tenderness and pain. He exhibits normal range of motion, no swelling, no effusion, no crepitus, no deformity and normal strength.       Right hip: He exhibits tenderness and bony tenderness. He exhibits normal range of motion, normal strength, no swelling, no crepitus and no deformity.       Lumbar back: He exhibits tenderness and pain. He exhibits normal range of motion, no bony tenderness, no swelling, no edema, no deformity and no spasm.  Tenderness at acromioclavicular joint left shoulder.  Tenderness to palpation at right greater trochanter.    Neurological: He is alert and oriented to person, place, and time. He has normal strength and  normal reflexes. No cranial nerve deficit or sensory deficit.  Skin: Skin is warm and dry. Bruising noted. No rash noted.     Psychiatric: He has a normal mood and affect. His speech is normal and behavior is normal. Judgment and thought content normal. Cognition and memory are normal.  Nursing note and vitals reviewed.   ED Course  Procedures (including critical care time)  Labs Review Labs Reviewed - No data to display  Imaging Review Dg Lumbar Spine Complete  11/25/2015  CLINICAL DATA:  Fall with severe pain.  Initial encounter. EXAM: LUMBAR SPINE - COMPLETE 4+ VIEW COMPARISON:  10/19/2010 FINDINGS: No evidence of acute fracture or traumatic malalignment. Lower lumbar facet arthropathy with chronic mild L4-5 retrolisthesis. Focal moderate to advanced disc narrowing at L5-S1 with mild endplate spurring. IMPRESSION: 1. No acute finding. 2. Lower lumbar degenerative changes which are stable from 2011. Electronically Signed   By: Marnee Spring M.D.   On: 11/25/2015 13:11   Dg Shoulder Left  11/25/2015  CLINICAL DATA:  Left shoulder pain after falling 3 days ago. EXAM: LEFT SHOULDER - 2+ VIEW COMPARISON:  None. FINDINGS: The mineralization and alignment are normal. There is no evidence of acute fracture or dislocation. The subacromial space is preserved. There are mild acromioclavicular and glenohumeral degenerative changes. IMPRESSION: No acute osseous findings. Electronically Signed   By: Carey Bullocks M.D.   On: 11/25/2015 13:09   Dg Hip Unilat With Pelvis 2-3 Views Right  11/25/2015  CLINICAL DATA:  Right hip pain after falling 3 days ago. Initial encounter. EXAM: DG HIP (WITH OR WITHOUT PELVIS) 2-3V RIGHT COMPARISON:  None. FINDINGS: The mineralization and alignment are normal. There is no evidence of acute fracture or dislocation. The hip joint spaces are maintained. No evidence of femoral head avascular necrosis. The sacroiliac joints appear unremarkable. IMPRESSION: No acute osseous  findings. Electronically Signed   By: Carey Bullocks M.D.   On: 11/25/2015 13:10    MDM   1. Fall   2. Abdominal contusion, initial encounter   3. Shoulder pain, acute, left   4. Acute right hip pain   5. Lumbar spine pain   Reviewed films with patient.  He may return to work tomorrow.  Given contusions he may be sore for several weeks. Please refer to [employer] paperwork. He should follow-up with his employee health.  Plan: Test/x-ray results and diagnosis reviewed with patient Rx as per orders;  benefits, risks, potential side effects reviewed  Recommend supportive treatment with rest, warm compresses 4x daily He is instructed to see PCP for followup with his blood pressure as it has been elevated last 2 encounters here Seek additional medical care if symptoms worsen or are not improving     Joselyn Arrow, NP 11/25/15 1336

## 2016-07-17 ENCOUNTER — Encounter: Payer: Self-pay | Admitting: Emergency Medicine

## 2016-07-17 ENCOUNTER — Emergency Department
Admission: EM | Admit: 2016-07-17 | Discharge: 2016-07-17 | Disposition: A | Discharge status: Home / Self Care | Payer: Self-pay | Attending: Emergency Medicine | Admitting: Emergency Medicine

## 2016-07-17 DIAGNOSIS — Z791 Long term (current) use of non-steroidal anti-inflammatories (NSAID): Secondary | ICD-10-CM | POA: Insufficient documentation

## 2016-07-17 DIAGNOSIS — R197 Diarrhea, unspecified: Secondary | ICD-10-CM | POA: Insufficient documentation

## 2016-07-17 DIAGNOSIS — I10 Essential (primary) hypertension: Secondary | ICD-10-CM | POA: Insufficient documentation

## 2016-07-17 DIAGNOSIS — R112 Nausea with vomiting, unspecified: Secondary | ICD-10-CM

## 2016-07-17 DIAGNOSIS — E860 Dehydration: Secondary | ICD-10-CM | POA: Insufficient documentation

## 2016-07-17 DIAGNOSIS — F17210 Nicotine dependence, cigarettes, uncomplicated: Secondary | ICD-10-CM | POA: Insufficient documentation

## 2016-07-17 DIAGNOSIS — J449 Chronic obstructive pulmonary disease, unspecified: Secondary | ICD-10-CM | POA: Insufficient documentation

## 2016-07-17 LAB — URINALYSIS COMPLETE WITH MICROSCOPIC (ARMC ONLY)
BACTERIA UA: NONE SEEN
BILIRUBIN URINE: NEGATIVE
Glucose, UA: NEGATIVE mg/dL
HGB URINE DIPSTICK: NEGATIVE
Ketones, ur: NEGATIVE mg/dL
LEUKOCYTES UA: NEGATIVE
NITRITE: NEGATIVE
PH: 5 (ref 5.0–8.0)
Protein, ur: NEGATIVE mg/dL
RBC / HPF: NONE SEEN RBC/hpf (ref 0–5)
SPECIFIC GRAVITY, URINE: 1.021 (ref 1.005–1.030)
SQUAMOUS EPITHELIAL / LPF: NONE SEEN

## 2016-07-17 LAB — COMPREHENSIVE METABOLIC PANEL
ALT: 60 U/L (ref 17–63)
ANION GAP: 8 (ref 5–15)
AST: 86 U/L — ABNORMAL HIGH (ref 15–41)
Albumin: 3.6 g/dL (ref 3.5–5.0)
Alkaline Phosphatase: 98 U/L (ref 38–126)
BUN: 6 mg/dL (ref 6–20)
CHLORIDE: 106 mmol/L (ref 101–111)
CO2: 27 mmol/L (ref 22–32)
Calcium: 9 mg/dL (ref 8.9–10.3)
Creatinine, Ser: 0.65 mg/dL (ref 0.61–1.24)
Glucose, Bld: 110 mg/dL — ABNORMAL HIGH (ref 65–99)
POTASSIUM: 3.9 mmol/L (ref 3.5–5.1)
Sodium: 141 mmol/L (ref 135–145)
TOTAL PROTEIN: 7 g/dL (ref 6.5–8.1)
Total Bilirubin: 0.4 mg/dL (ref 0.3–1.2)

## 2016-07-17 LAB — CK: CK TOTAL: 85 U/L (ref 49–397)

## 2016-07-17 LAB — TROPONIN I

## 2016-07-17 LAB — CBC
HEMATOCRIT: 45.4 % (ref 40.0–52.0)
Hemoglobin: 15.4 g/dL (ref 13.0–18.0)
MCH: 32.4 pg (ref 26.0–34.0)
MCHC: 34 g/dL (ref 32.0–36.0)
MCV: 95.4 fL (ref 80.0–100.0)
Platelets: 204 10*3/uL (ref 150–440)
RBC: 4.76 MIL/uL (ref 4.40–5.90)
RDW: 13 % (ref 11.5–14.5)
WBC: 8.4 10*3/uL (ref 3.8–10.6)

## 2016-07-17 LAB — LIPASE, BLOOD: LIPASE: 31 U/L (ref 11–51)

## 2016-07-17 MED ORDER — SODIUM CHLORIDE 0.9 % IV BOLUS (SEPSIS)
1000.0000 mL | Freq: Once | INTRAVENOUS | Status: AC
  Administered 2016-07-17: 1000 mL via INTRAVENOUS

## 2016-07-17 MED ORDER — ONDANSETRON HCL 4 MG/2ML IJ SOLN
4.0000 mg | Freq: Once | INTRAMUSCULAR | Status: AC
  Administered 2016-07-17: 4 mg via INTRAVENOUS
  Filled 2016-07-17: qty 2

## 2016-07-17 NOTE — Discharge Instructions (Signed)
Although no certain cause was found for your symptoms, your exam and evaluation are reassuring in the Emergency Department today.  Return to the Emergency Department for any new or worsening symptoms including vomiting blood, black or blood stools, fever, trouble breathing, abdominal pain, dizziness or passing out, weakness or numbness, or any other symptoms concerning to you.

## 2016-07-17 NOTE — ED Notes (Signed)

## 2016-07-17 NOTE — ED Provider Notes (Signed)
[facility]  I accepted care from Dr. Lenard Lance ____________________________________________    LABS (pertinent positives/negatives)  Labs Reviewed  COMPREHENSIVE METABOLIC PANEL - Abnormal; Notable for the following:       Result Value   Glucose, Bld 110 (*)    AST 86 (*)    All other components within normal limits  LIPASE, BLOOD  CBC  TROPONIN I  CK  URINALYSIS COMPLETEWITH MICROSCOPIC ([facility] ONLY)      ____________________________________________    RADIOLOGY All xrays were viewed by me. Imaging interpreted by radiologist.  None  ____________________________________________   PROCEDURES  Procedure(s) performed: None  Critical Care performed: None  ____________________________________________   INITIAL IMPRESSION / ASSESSMENT AND PLAN / ED COURSE   Pertinent labs & imaging results that were available during my care of the patient were reviewed by me and considered in my medical decision making (see chart for details).  I accepted care at shift change from Dr. Lenard Lance. Patient was awaiting troponin and CK, the rest of his blood work and evaluation were reassuring. The patient received IV fluids. Although no certain cause was found, he may have had heat exhaustion over the weekend. Perhaps he has a virus. Ultimately, his exam and evaluation are reassuring. Patient discharged from the ED to follow-up with a primary care physician, referred to [facility] clinic.  CONSULTATIONS: None    Patient / Family / Caregiver informed of clinical course, medical decision-making process, and agree with plan.   I discussed return precautions, follow-up instructions, and discharged instructions with patient and/or family.     ____________________________________________   FINAL CLINICAL IMPRESSION(S) / ED DIAGNOSES  Final diagnoses:  Nausea vomiting and diarrhea        Governor Rooks, MD 07/17/16 2211

## 2016-07-17 NOTE — ED Triage Notes (Signed)
[place] got home started feeling bad. Pt complains of abdominal cramping, N/VV/D since Sunday.

## 2016-07-17 NOTE — ED Provider Notes (Signed)
[facility] Emergency Department Provider Note  Time seen: 8:07 PM  I have reviewed the triage vital signs and the nursing notes.   HISTORY  Chief Complaint Abdominal Pain and Emesis    HPI Andre Wagner is a 55 y.o. male with a past medical history of COPD and hypertension who presents the emergency department for cramping, nausea and vomiting. According to the patient he was out at the [facility] Saturday and Sunday, states he felt very overheated both of those days. Patient works in a [employer] which she states is over 100. He states for the past 3 days he has been feeling lightheaded, dizzy at times, intermittent nausea, vomiting, with diffuse muscle cramps and occasional abdominal cramps. He also states occasional episodes of loose stool. Denies any abdominal pain. Denies any chest pain, trouble breathing. He did state Sunday he felt like he was going to pass out, but did not pass out. Describes a muscle cramps is moderate when they occur. Describes the nausea as moderate.  Past Medical History:  Diagnosis Date  . COPD (chronic obstructive pulmonary disease) (HCC)   . Hypertension     There are no active problems to display for this patient.   Past Surgical History:  Procedure Laterality Date  . BACK SURGERY    . FACIAL FRACTURE SURGERY    . FRACTURE SURGERY      Prior to Admission medications   Medication Sig Start Date End Date Taking? Authorizing Provider  naproxen (NAPROSYN) 375 MG tablet Take 1 tablet (375 mg total) by mouth 2 (two) times daily. 11/25/15   Joselyn Arrow, NP    No Known Allergies  History reviewed. No pertinent family history.  Social History Social History  Substance Use Topics  . Smoking status: Current Every Day Smoker    Packs/day: 0.50    Types: Cigarettes  . Smokeless tobacco: Never Used  . Alcohol use Yes     Comment: occassionally    Review of Systems Constitutional: Negative for fever. Cardiovascular:  Negative for chest pain. Respiratory: Negative for shortness of breath. Gastrointestinal: Negative for abdominal pain. Positive for nausea, vomiting, diarrhea. Genitourinary: Negative for dysuria. Musculoskeletal: Negative for back pain. Positive for muscle cramps. Neurological: Negative for headache 10-point ROS otherwise negative.  ____________________________________________   PHYSICAL EXAM:  VITAL SIGNS: ED Triage Vitals  Enc Vitals Group     BP 07/17/16 1917 (!) 152/96     Pulse Rate 07/17/16 1917 (!) 111     Resp 07/17/16 1917 18     Temp 07/17/16 1917 98.5 F (36.9 C)     Temp Source 07/17/16 1917 Oral     SpO2 07/17/16 1917 95 %     Weight 07/17/16 1917 170 lb (77.1 kg)     Height 07/17/16 1917  (1.676 m)     Head Circumference --      Peak Flow --      Pain Score 07/17/16 1918 5     Pain Loc --      Pain Edu? --      Excl. in GC? --    Constitutional: Alert and oriented. Well appearing and in no distress. Eyes: Normal exam ENT   Head: Normocephalic and atraumatic.   Mouth/Throat: Mucous membranes are moist. Cardiovascular: Normal rate, regular rhythm. No murmur Respiratory: Normal respiratory effort without tachypnea nor retractions. Breath sounds are clear Gastrointestinal: Soft and nontender. No distention.   Musculoskeletal: Nontender with normal range of motion in all extremities. No  lower extremity tenderness Neurologic:  Normal speech and language.  Skin:  Skin is warm, dry and intact.  Psychiatric: Mood and affect are normal.  ____________________________________________    INITIAL IMPRESSION / ASSESSMENT AND PLAN / ED COURSE  Pertinent labs & imaging results that were available during my care of the patient were reviewed by me and considered in my medical decision making (see chart for details).  Patient presents the emergency department with concerns of heat exhaustion and dehydration. Patient states muscle cramping, nausea, vomiting,  intermittent diarrhea. Denies any abdominal pain. No abdominal tenderness. We will check labs, including a CK, EKG, IV hydrate and treat nausea. Patient is agreeable to plan.  ____________________________________________   FINAL CLINICAL IMPRESSION(S) / ED DIAGNOSES  Dehydration Nausea vomiting diarrhea    Minna Antis, MD 07/18/16 [phone]

## 2016-10-09 ENCOUNTER — Other Ambulatory Visit: Payer: Self-pay | Admitting: Surgery

## 2016-10-09 DIAGNOSIS — R160 Hepatomegaly, not elsewhere classified: Secondary | ICD-10-CM

## 2016-10-11 ENCOUNTER — Ambulatory Visit
Admission: RE | Admit: 2016-10-11 | Discharge: 2016-10-11 | Disposition: A | Discharge status: Home / Self Care | Payer: 59 | Source: Ambulatory Visit | Attending: Surgery | Admitting: Surgery

## 2016-10-11 DIAGNOSIS — I700 Atherosclerosis of aorta: Secondary | ICD-10-CM | POA: Diagnosis not present

## 2016-10-11 DIAGNOSIS — K760 Fatty (change of) liver, not elsewhere classified: Secondary | ICD-10-CM | POA: Insufficient documentation

## 2016-10-11 DIAGNOSIS — R160 Hepatomegaly, not elsewhere classified: Secondary | ICD-10-CM | POA: Insufficient documentation

## 2016-10-11 DIAGNOSIS — K429 Umbilical hernia without obstruction or gangrene: Secondary | ICD-10-CM | POA: Insufficient documentation

## 2016-10-11 MED ORDER — IOPAMIDOL (ISOVUE-370) INJECTION 76%
125.0000 mL | Freq: Once | INTRAVENOUS | Status: AC | PRN
  Administered 2016-10-11: 125 mL via INTRAVENOUS

## 2016-10-18 NOTE — Patient Instructions (Signed)
  Your procedure is scheduled on: 10-25-16 (FRIDAY) Report to Same Day Surgery 2nd floor medical mall To find out your arrival time please call [phone] between 1PM - 3PM on 10-24-16 [facility](THURSDAY)  Remember: Instructions that are not followed completely may result in serious medical risk, up to and including death, or upon the discretion of your surgeon and anesthesiologist your surgery may need to be rescheduled.    _x___ 1. Do not eat food or drink liquids after midnight. No gum chewing or hard candies.     __x__ 2. No Alcohol for 24 hours before or after surgery.   __x__3. No Smoking for 24 prior to surgery.   ____  4. Bring all medications with you on the day of surgery if instructed.    __x__ 5. Notify your doctor if there is any change in your medical condition     (cold, fever, infections).     Do not wear jewelry, make-up, hairpins, clips or nail polish.  Do not wear lotions, powders, or perfumes. You may wear deodorant.  Do not shave 48 hours prior to surgery. Men may shave face and neck.  Do not bring valuables to the hospital.    [facility] any belongings or valuables.               Contacts, dentures or bridgework may not be worn into surgery.  Leave your suitcase in the car. After surgery it may be brought to your room.  For patients admitted to the hospital, discharge time is determined by your treatment team.   Patients discharged the day of surgery will not be allowed to drive home.    Please read over the following fact sheets that you were given:   [facility] Pre[facility] and or MRSA Information   _x___ Take these medicines the morning of surgery with A SIP OF WATER:    1. AMLODIPINE (NORVASC)  2.  3.  4.  5.  6.  ____Fleets enema or Magnesium Citrate as directed.   _x___ Use CHG Soap or sage wipes as directed on instruction sheet   ____ Use inhalers on the day of surgery and bring to hospital day of surgery  ____  Stop metformin 2 days prior to surgery    ____ Take 1/2 of usual insulin dose the night before surgery and none on the morning of  surgery.   ____ Stop aspirin or coumadin, or plavix  x__ Stop Anti-inflammatories such as Advil, Aleve, Ibuprofen, Motrin, Naproxen,          Naprosyn, Goodies powders or aspirin products. Ok to take Tylenol.   ____ Stop supplements until after surgery.    ____ Bring C-Pap to the hospital.

## 2016-10-21 ENCOUNTER — Encounter
Admission: RE | Admit: 2016-10-21 | Discharge: 2016-10-21 | Disposition: A | Discharge status: Home / Self Care | Payer: 59 | Source: Ambulatory Visit | Attending: Surgery | Admitting: Surgery

## 2016-10-21 DIAGNOSIS — R000 Tachycardia, unspecified: Secondary | ICD-10-CM | POA: Diagnosis not present

## 2016-10-21 DIAGNOSIS — I10 Essential (primary) hypertension: Secondary | ICD-10-CM | POA: Diagnosis not present

## 2016-10-21 HISTORY — DX: Gastro-esophageal reflux disease without esophagitis: K21.9

## 2016-10-21 HISTORY — DX: Panic disorder (episodic paroxysmal anxiety): F41.0

## 2016-10-21 NOTE — Pre-Procedure Instructions (Signed)
NOTE SINUS TACH EKG ALSO 2016

## 2016-10-24 ENCOUNTER — Encounter: Payer: Self-pay | Admitting: *Deleted

## 2016-10-24 MED ORDER — CEFAZOLIN SODIUM-DEXTROSE 2-4 GM/100ML-% IV SOLN
2.0000 g | Freq: Once | INTRAVENOUS | Status: AC
  Administered 2016-10-25: 2 g via INTRAVENOUS

## 2016-10-25 ENCOUNTER — Encounter
Admission: RE | Disposition: A | Discharge status: Home / Self Care | Payer: Self-pay | Source: Ambulatory Visit | Attending: Surgery

## 2016-10-25 ENCOUNTER — Ambulatory Visit: Payer: 59 | Admitting: Anesthesiology

## 2016-10-25 ENCOUNTER — Ambulatory Visit
Admission: RE | Admit: 2016-10-25 | Discharge: 2016-10-25 | Disposition: A | Discharge status: Home / Self Care | Payer: 59 | Source: Ambulatory Visit | Attending: Surgery | Admitting: Surgery

## 2016-10-25 DIAGNOSIS — J449 Chronic obstructive pulmonary disease, unspecified: Secondary | ICD-10-CM | POA: Insufficient documentation

## 2016-10-25 DIAGNOSIS — K429 Umbilical hernia without obstruction or gangrene: Secondary | ICD-10-CM | POA: Diagnosis present

## 2016-10-25 DIAGNOSIS — F17210 Nicotine dependence, cigarettes, uncomplicated: Secondary | ICD-10-CM | POA: Diagnosis not present

## 2016-10-25 DIAGNOSIS — K219 Gastro-esophageal reflux disease without esophagitis: Secondary | ICD-10-CM | POA: Diagnosis not present

## 2016-10-25 DIAGNOSIS — I10 Essential (primary) hypertension: Secondary | ICD-10-CM | POA: Diagnosis not present

## 2016-10-25 HISTORY — PX: UMBILICAL HERNIA REPAIR: SHX196

## 2016-10-25 HISTORY — DX: Chronic pain syndrome: G89.4

## 2016-10-25 LAB — URINE DRUG SCREEN, QUALITATIVE (ARMC ONLY)
AMPHETAMINES, UR SCREEN: NOT DETECTED
BENZODIAZEPINE, UR SCRN: NOT DETECTED
Barbiturates, Ur Screen: NOT DETECTED
Cannabinoid 50 Ng, Ur ~~LOC~~: NOT DETECTED
Cocaine Metabolite,Ur ~~LOC~~: NOT DETECTED
MDMA (ECSTASY) UR SCREEN: NOT DETECTED
METHADONE SCREEN, URINE: NOT DETECTED
OPIATE, UR SCREEN: NOT DETECTED
PHENCYCLIDINE (PCP) UR S: NOT DETECTED
Tricyclic, Ur Screen: NOT DETECTED

## 2016-10-25 SURGERY — REPAIR, HERNIA, UMBILICAL, ADULT
Anesthesia: General | Site: Abdomen | Wound class: Clean

## 2016-10-25 MED ORDER — OXYCODONE HCL 5 MG PO TABS
ORAL_TABLET | ORAL | Status: AC
  Filled 2016-10-25: qty 1

## 2016-10-25 MED ORDER — CEFAZOLIN SODIUM-DEXTROSE 2-4 GM/100ML-% IV SOLN
INTRAVENOUS | Status: AC
  Filled 2016-10-25: qty 100

## 2016-10-25 MED ORDER — LACTATED RINGERS IV SOLN
INTRAVENOUS | Status: DC
  Administered 2016-10-25 (×2): via INTRAVENOUS

## 2016-10-25 MED ORDER — BUPIVACAINE-EPINEPHRINE 0.5% -1:200000 IJ SOLN
INTRAMUSCULAR | Status: DC | PRN
  Administered 2016-10-25: 5 mL

## 2016-10-25 MED ORDER — FENTANYL CITRATE (PF) 100 MCG/2ML IJ SOLN
INTRAMUSCULAR | Status: AC
  Filled 2016-10-25: qty 2

## 2016-10-25 MED ORDER — ROCURONIUM BROMIDE 100 MG/10ML IV SOLN
INTRAVENOUS | Status: DC | PRN
  Administered 2016-10-25: 25 mg via INTRAVENOUS

## 2016-10-25 MED ORDER — FENTANYL CITRATE (PF) 100 MCG/2ML IJ SOLN
25.0000 ug | INTRAMUSCULAR | Status: DC | PRN
  Administered 2016-10-25 (×3): 50 ug via INTRAVENOUS

## 2016-10-25 MED ORDER — SUGAMMADEX SODIUM 500 MG/5ML IV SOLN
INTRAVENOUS | Status: DC | PRN
  Administered 2016-10-25: 200 mg via INTRAVENOUS

## 2016-10-25 MED ORDER — DEXMEDETOMIDINE HCL 200 MCG/2ML IV SOLN
INTRAVENOUS | Status: DC | PRN
  Administered 2016-10-25: 20 ug via INTRAVENOUS

## 2016-10-25 MED ORDER — IPRATROPIUM-ALBUTEROL 0.5-2.5 (3) MG/3ML IN SOLN
3.0000 mL | Freq: Once | RESPIRATORY_TRACT | Status: AC
  Administered 2016-10-25: 3 mL via RESPIRATORY_TRACT

## 2016-10-25 MED ORDER — OXYCODONE HCL 5 MG/5ML PO SOLN: 5.0000 mg | Freq: Once | ORAL | Status: AC | PRN

## 2016-10-25 MED ORDER — SUCCINYLCHOLINE CHLORIDE 20 MG/ML IJ SOLN
INTRAMUSCULAR | Status: DC | PRN
  Administered 2016-10-25: 100 mg via INTRAVENOUS

## 2016-10-25 MED ORDER — FAMOTIDINE 20 MG PO TABS
20.0000 mg | ORAL_TABLET | Freq: Once | ORAL | Status: AC
  Administered 2016-10-25: 20 mg via ORAL

## 2016-10-25 MED ORDER — OXYCODONE HCL 5 MG PO TABS
5.0000 mg | ORAL_TABLET | Freq: Once | ORAL | Status: AC | PRN
  Administered 2016-10-25: 5 mg via ORAL

## 2016-10-25 MED ORDER — BUPIVACAINE-EPINEPHRINE (PF) 0.5% -1:200000 IJ SOLN
INTRAMUSCULAR | Status: AC
  Filled 2016-10-25: qty 30

## 2016-10-25 MED ORDER — FAMOTIDINE 20 MG PO TABS
ORAL_TABLET | ORAL | Status: AC
  Administered 2016-10-25: 20 mg via ORAL
  Filled 2016-10-25: qty 1

## 2016-10-25 MED ORDER — ONDANSETRON HCL 4 MG/2ML IJ SOLN
INTRAMUSCULAR | Status: DC | PRN
  Administered 2016-10-25: 4 mg via INTRAVENOUS

## 2016-10-25 MED ORDER — IPRATROPIUM-ALBUTEROL 0.5-2.5 (3) MG/3ML IN SOLN
RESPIRATORY_TRACT | Status: AC
  Filled 2016-10-25: qty 3

## 2016-10-25 MED ORDER — LIDOCAINE HCL (CARDIAC) 20 MG/ML IV SOLN
INTRAVENOUS | Status: DC | PRN
  Administered 2016-10-25: 100 mg via INTRAVENOUS

## 2016-10-25 MED ORDER — PROPOFOL 10 MG/ML IV BOLUS
INTRAVENOUS | Status: DC | PRN
  Administered 2016-10-25: 10 mg via INTRAVENOUS
  Administered 2016-10-25: 150 mg via INTRAVENOUS

## 2016-10-25 MED ORDER — OXYCODONE-ACETAMINOPHEN 5-325 MG PO TABS: 1.0000 | ORAL_TABLET | ORAL | 0 refills | Status: DC | PRN

## 2016-10-25 MED ORDER — FENTANYL CITRATE (PF) 100 MCG/2ML IJ SOLN
INTRAMUSCULAR | Status: DC | PRN
  Administered 2016-10-25: 100 ug via INTRAVENOUS

## 2016-10-25 SURGICAL SUPPLY — 29 items
ADH SKN CLS APL DERMABOND .7 (GAUZE/BANDAGES/DRESSINGS) ×1
BLADE SURG 15 STRL LF DISP TIS (BLADE) ×1 IMPLANT
BLADE SURG 15 STRL SS (BLADE) ×3
CANISTER SUCT 1200ML W/VALVE (MISCELLANEOUS) ×3 IMPLANT
CHLORAPREP W/TINT 26ML (MISCELLANEOUS) ×3 IMPLANT
DECANTER SPIKE VIAL GLASS SM (MISCELLANEOUS) ×2 IMPLANT
DERMABOND ADVANCED (GAUZE/BANDAGES/DRESSINGS) ×2
DERMABOND ADVANCED .7 DNX12 (GAUZE/BANDAGES/DRESSINGS) IMPLANT
DRAPE LAPAROTOMY 77X122 PED (DRAPES) ×3 IMPLANT
ELECT REM PT RETURN 9FT ADLT (ELECTROSURGICAL) ×3
ELECTRODE REM PT RTRN 9FT ADLT (ELECTROSURGICAL) ×1 IMPLANT
GLOVE BIO SURGEON STRL SZ7.5 (GLOVE) ×3 IMPLANT
GOWN STRL REUS W/ TWL LRG LVL3 (GOWN DISPOSABLE) ×3 IMPLANT
GOWN STRL REUS W/TWL LRG LVL3 (GOWN DISPOSABLE) ×9
KIT RM TURNOVER STRD PROC AR (KITS) ×3 IMPLANT
LABEL OR SOLS (LABEL) ×3 IMPLANT
LIQUID BAND (GAUZE/BANDAGES/DRESSINGS) ×3 IMPLANT
MESH SYNTHETIC 4X6 SOFT BARD (Mesh General) ×1 IMPLANT
MESH SYNTHETIC SOFT BARD 4X6 (Mesh General) ×2 IMPLANT
NDL HYPO 25X1 1.5 SAFETY (NEEDLE) ×1 IMPLANT
NEEDLE HYPO 25X1 1.5 SAFETY (NEEDLE) ×3 IMPLANT
NS IRRIG 500ML POUR BTL (IV SOLUTION) ×3 IMPLANT
PACK BASIN MINOR ARMC (MISCELLANEOUS) ×3 IMPLANT
SUT CHROMIC 3 0 SH 27 (SUTURE) ×3 IMPLANT
SUT CHROMIC 4 0 RB 1X27 (SUTURE) ×3 IMPLANT
SUT MNCRL+ 5-0 UNDYED PC-3 (SUTURE) ×1 IMPLANT
SUT MONOCRYL 5-0 (SUTURE) ×2
SUT SURGILON 0 30 BLK (SUTURE) ×6 IMPLANT
SYRINGE 10CC LL (SYRINGE) ×3 IMPLANT

## 2016-10-25 NOTE — Anesthesia Postprocedure Evaluation (Signed)
Anesthesia Post Note  Patient: Andre Wagner  Procedure(s) Performed: Procedure(s) (LRB): HERNIA REPAIR UMBILICAL ADULT (N/A)  Patient location during evaluation: PACU Anesthesia Type: General Level of consciousness: awake and alert Pain management: pain level controlled Vital Signs Assessment: post-procedure vital signs reviewed and stable Respiratory status: spontaneous breathing, nonlabored ventilation, respiratory function stable and patient connected to nasal cannula oxygen Cardiovascular status: blood pressure returned to baseline and stable Postop Assessment: no signs of nausea or vomiting Anesthetic complications: no    Last Vitals:  Vitals:   10/25/16 1411 10/25/16 1426  BP: 138/86 (!) 149/96  Pulse: 100 99  Resp: 19 17  Temp:      Last Pain:  Vitals:   10/25/16 1426  TempSrc:   PainSc: 3                  Cleda MccreedyJoseph K Piscitello

## 2016-10-25 NOTE — Anesthesia Preprocedure Evaluation (Signed)
Anesthesia Evaluation  Patient identified by MRN, date of birth, ID band Patient awake    Reviewed: Allergy & Precautions, H&P , NPO status , Patient's Chart, lab work & pertinent test results  History of Anesthesia Complications Negative for: history of anesthetic complications  Airway Mallampati: III  TM Distance: <3 FB Neck ROM: limited    Dental no notable dental hx. (+) Poor Dentition, Chipped, Missing   Pulmonary neg shortness of breath, COPD, Current Smoker,    Pulmonary exam normal breath sounds clear to auscultation       Cardiovascular Exercise Tolerance: Good hypertension, (-) angina(-) Past MI and (-) DOE + dysrhythmias (patient reports baseline sinus tach which is also seen in past EKGs)  Rhythm:regular Rate:Tachycardia     Neuro/Psych negative neurological ROS  negative psych ROS   GI/Hepatic Neg liver ROS, GERD  Controlled,  Endo/Other  negative endocrine ROS  Renal/GU      Musculoskeletal   Abdominal   Peds  Hematology negative hematology ROS (+)   Anesthesia Other Findings Past Medical History: No date: Chronic pain disorder No date: GERD (gastroesophageal reflux disease) No date: Hypertension No date: Panic attack     Comment: H/O  Past Surgical History: No date: BACK SURGERY No date: FACIAL FRACTURE SURGERY No date: FRACTURE SURGERY No date: HARVEST BONE GRAFT     Comment: from right hip to repair fracture of the               mandible No date: NASAL SINUS SURGERY  BMI    Body Mass Index:  29.05 kg/m      Reproductive/Obstetrics negative OB ROS                             Anesthesia Physical Anesthesia Plan  ASA: III  Anesthesia Plan: General ETT   Post-op Pain Management:    Induction:   Airway Management Planned:   Additional Equipment:   Intra-op Plan:   Post-operative Plan:   Informed Consent: I have reviewed the patients History and  Physical, chart, labs and discussed the procedure including the risks, benefits and alternatives for the proposed anesthesia with the patient or authorized representative who has indicated his/her understanding and acceptance.     Plan Discussed with: Anesthesiologist, CRNA and Surgeon  Anesthesia Plan Comments:         Anesthesia Quick Evaluation

## 2016-10-25 NOTE — Op Note (Signed)
OPERATIVE REPORT  PREOPERATIVE  DIAGNOSIS: Umbilical hernia  POSTOPERATIVE DIAGNOSIS: Umbilical hernia  PROCEDURE: Umbilical hernia repair  ANESTHESIA: General  SURGEON: Renda RollsWilton Heiress Williamson M.D.  INDICATIONS:  He had recent episodes of pain at the umbilicus. An umbilical hernia was demonstrated on physical exam and repair was recommended for definitive treatment.    The patient was placed on the operating table in the supine position under general anesthesia. The abdomen was prepared with ChloraPrep and draped in a sterile manner. A transversely oriented  supraumbilical curvilinear incision was made and carried down through subcutaneous tissues. Dissection was carried out to isolate the hernia sac from surrounding tissues and inverted. The sac and properitoneal fat was dissected away away from the fascial ring defect. Bard soft mesh was cut to create an oval shape of 1.2 x 1.8 cm. It was placed into the properitoneal plane oriented transversely and sutured to the overlying fascia with through and through 0 Surgilon sutures.  The fascial defect was closed with a transversely oriented suture line of interrupted 0 Surgilon figure-of-eight sutures incorporating each suture into the mesh.. The subcutaneous tissues were infiltrated with half percent Sensorcaine with epinephrine. The skin was closed with running 5-0 Monocryl subcuticular suture and LiquiBand.  The patient appeared to be in satisfactory condition and was prepared for transfer to the recovery room  Renda RollsWilton Zakkery Dorian M.D.

## 2016-10-25 NOTE — Anesthesia Procedure Notes (Signed)
Procedure Name: Intubation Date/Time: 10/25/2016 1:00 PM Performed by: Almeta MonasFLETCHER, Shannia Jacuinde Pre-anesthesia Checklist: Patient identified, Emergency Drugs available, Suction available, Patient being monitored and Timeout performed Patient Re-evaluated:Patient Re-evaluated prior to inductionOxygen Delivery Method: Circle system utilized Preoxygenation: Pre-oxygenation with 100% oxygen Intubation Type: IV induction Ventilation: Mask ventilation without difficulty Laryngoscope Size: Mac and 3 Grade View: Grade I Tube type: Oral Tube size: 7.0 mm Number of attempts: 1 Airway Equipment and Method: Patient positioned with wedge pillow and Stylet Placement Confirmation: ETT inserted through vocal cords under direct vision,  breath sounds checked- equal and bilateral,  CO2 detector and positive ETCO2 Secured at: 20 cm Tube secured with: Tape Dental Injury: Teeth and Oropharynx as per pre-operative assessment

## 2016-10-25 NOTE — Transfer of Care (Signed)
Immediate Anesthesia Transfer of Care Note  Patient: Andre Wagner  Procedure(s) Performed: Procedure(s): HERNIA REPAIR UMBILICAL ADULT (N/A)  Patient Location: PACU  Anesthesia Type:General  Level of Consciousness: sedated  Airway & Oxygen Therapy: Patient Spontanous Breathing and Patient connected to face mask oxygen  Post-op Assessment: Report given to RN and Post -op Vital signs reviewed and stable  Post vital signs: Reviewed and stable  Last Vitals:  Vitals:   10/25/16 1356 10/25/16 1411  BP: (!) 142/96 138/86  Pulse: (!) 104 100  Resp: (!) 22 19  Temp: 36.8 C     Last Pain:  Vitals:   10/25/16 1411  TempSrc:   PainSc: 4          Complications: No apparent anesthesia complications

## 2016-10-25 NOTE — OR Nursing (Signed)
1625: Dr. Katrinka BlazingSmith here.  Wrote work excuse for 4 weeks and OK'd dc home.

## 2016-10-25 NOTE — Discharge Instructions (Addendum)
AMBULATORY SURGERY  DISCHARGE INSTRUCTIONS   1) The drugs that you were given will stay in your system until tomorrow so for the next 24 hours you should not:  A) Drive an automobile B) Make any legal decisions C) Drink any alcoholic beverage   2) You may resume regular meals tomorrow.  Today it is better to start with liquids and gradually work up to solid foods.  You may eat anything you prefer, but it is better to start with liquids, then soup and crackers, and gradually work up to solid foods.   3) Please notify your doctor immediately if you have any unusual bleeding, trouble breathing, redness and pain at the surgery site, drainage, fever, or pain not relieved by medication.    4) Additional Instructions:  Take Tylenol or Percocet if needed for pain.  Should not drive or do anything dangerous when taking Percocet.  May shower.  Avoid straining and heavy lifting.        Please contact your physician with any problems or Same Day Surgery at [phone], Monday through Friday 6 am to 4 pm, or [place] at [facility] Main number at [phone].

## 2016-10-25 NOTE — Progress Notes (Signed)
He reports no change in condition since the day of the office visit. He does continue to have some pain intermittently with his umbilical hernia.  Lab work was noted.  I discussed the plan for umbilical hernia repair.

## 2016-12-26 ENCOUNTER — Encounter: Payer: Self-pay | Admitting: Surgery

## 2017-04-03 ENCOUNTER — Encounter (INDEPENDENT_AMBULATORY_CARE_PROVIDER_SITE_OTHER): Payer: Self-pay | Admitting: Vascular Surgery

## 2017-04-03 ENCOUNTER — Ambulatory Visit (INDEPENDENT_AMBULATORY_CARE_PROVIDER_SITE_OTHER): Payer: BLUE CROSS/BLUE SHIELD | Admitting: Vascular Surgery

## 2017-04-03 DIAGNOSIS — I10 Essential (primary) hypertension: Secondary | ICD-10-CM | POA: Diagnosis not present

## 2017-04-03 DIAGNOSIS — I83811 Varicose veins of right lower extremities with pain: Secondary | ICD-10-CM | POA: Diagnosis not present

## 2017-04-03 DIAGNOSIS — I872 Venous insufficiency (chronic) (peripheral): Secondary | ICD-10-CM | POA: Diagnosis not present

## 2017-04-03 DIAGNOSIS — M79606 Pain in leg, unspecified: Secondary | ICD-10-CM | POA: Insufficient documentation

## 2017-04-03 DIAGNOSIS — M79604 Pain in right leg: Secondary | ICD-10-CM | POA: Diagnosis not present

## 2017-04-03 NOTE — Progress Notes (Signed)
MRN : [id number]  Andre Wagner is a 56 y.o. (11-12-1961) male who presents with chief complaint of  Chief Complaint  Patient presents with  . New Patient (Initial Visit)  .  History of Present Illness: The patient is seen for evaluation of symptomatic painful right leg varicose veins. The patient relates burning and stinging which worsened steadily throughout the course of the day, particularly with standing. The patient also notes an aching and throbbing pain over the varicosities, particularly with prolonged dependent positions. The symptoms are significantly improved with elevation.  The patient also notes that during hot weather the symptoms are greatly intensified. The patient states the pain from the varicose veins interferes with work, daily exercise, shopping and household maintenance. At this point, the symptoms are persistent and severe enough that they're having a negative impact on lifestyle and are interfering with daily activities.  There is no history of DVT, PE or superficial thrombophlebitis. There is no history of ulceration or hemorrhage. The patient denies a significant family history of varicose veins.  The patient has not worn graduated compression in the past. At the present time the patient has not been using over-the-counter analgesics. There is no history of prior surgical intervention or sclerotherapy.    Current Meds  Medication Sig  . amLODipine (NORVASC) 5 MG tablet Take 5 mg by mouth every morning.     Past Medical History:  Diagnosis Date  . Chronic pain disorder   . GERD (gastroesophageal reflux disease)   . Hypertension   . Panic attack    H/O    Past Surgical History:  Procedure Laterality Date  . BACK SURGERY    . FACIAL FRACTURE SURGERY    . FRACTURE SURGERY    . HARVEST BONE GRAFT     from right hip to repair fracture of the mandible  . NASAL SINUS SURGERY    . UMBILICAL HERNIA REPAIR N/A 10/25/2016   Procedure: HERNIA REPAIR  UMBILICAL ADULT;  Surgeon: Nadeen Landau, MD;  Location: [facility] ORS;  Service: General;  Laterality: N/A;    Social History Social History  Substance Use Topics  . Smoking status: Current Every Day Smoker    Packs/day: 0.50    Years: 25.00    Types: Cigarettes  . Smokeless tobacco: Never Used  . Alcohol use 1.2 oz/week    2 Glasses of wine per week     Comment: occassionally    Family History No family history on file. No family history of bleeding/clotting disorders, porphyria or autoimmune disease   Allergies  Allergen Reactions  . Codeine Itching    Is fine when he takes benadryl with it     REVIEW OF SYSTEMS (Negative unless checked)  Constitutional: Weight loss  Fever  Chills Cardiac: Chest pain   Chest pressure   Palpitations   Shortness of breath when laying flat   Shortness of breath with exertion. Vascular:  Pain in legs with walking   Pain in legs with standing  History of DVT   Phlebitis   Swelling in legs   Varicose veins   Non-healing ulcers Pulmonary:   Uses home oxygen   Productive cough   Hemoptysis   Wheeze  COPD   Asthma Neurologic:  Dizziness   Seizures   History of stroke   History of TIA  Aphasia   Vissual changes   Weakness or numbness in arm   Weakness or numbness in leg Musculoskeletal:   Joint swelling   Joint  pain   Low back pain Hematologic:  Easy bruising  Easy bleeding   Hypercoagulable state   Anemic Gastrointestinal:  Diarrhea   Vomiting  Gastroesophageal reflux/heartburn   Difficulty swallowing. Genitourinary:  Chronic kidney disease   Difficult urination  Frequent urination   Blood in urine Skin:  Rashes   Ulcers  Psychological:  History of anxiety    History of major depression.  Physical Examination  Vitals:   04/03/17 0854  BP: (!) 161/101  Pulse: 98  Resp: 18  Weight: 180 lb (81.6 kg)  Height:  (1.676 m)   Body mass index is  29.05 kg/m. Gen: WD/WN, NAD Head: [place]/AT, No temporalis wasting.  Ear/Nose/Throat: Hearing grossly intact, nares w/o erythema or drainage, poor dentition Eyes: PER, EOMI, sclera nonicteric.  Neck: Supple, no masses.  No bruit or JVD.  Pulmonary:  Good air movement, clear to auscultation bilaterally, no use of accessory muscles.  Cardiac: RRR, normal S1, S2, no Murmurs. Vascular: Large varicosities present extensively on the right leg which are greater than 10 mm.  Tender to palpation.  Moderate venous stasis changes to the legs bilaterally.  2+ soft pitting edema Vessel Right Left  Radial Palpable Palpable  Ulnar Palpable Palpable  Brachial Palpable Palpable  Carotid Palpable Palpable  Femoral Palpable Palpable  Popliteal Palpable Palpable  PT Trace Palpable Trace Palpable  DP 1+ Palpable 1+ Palpable  Gastrointestinal: soft, non-distended. No guarding/no peritoneal signs.  Musculoskeletal: M/S 5/5 throughout.  No deformity or atrophy.  Neurologic: CN 2-12 intact. Pain and light touch intact in extremities.  Symmetrical.  Speech is fluent. Motor exam as listed above. Psychiatric: Judgment intact, Mood & affect appropriate for pt's clinical situation. Dermatologic: No rashes or ulcers noted.  No changes consistent with cellulitis. Lymph : No Cervical lymphadenopathy, no lichenification or skin changes of chronic lymphedema.  CBC Lab Results  Component Value Date   WBC 8.4 07/17/2016   HGB 15.4 07/17/2016   HCT 45.4 07/17/2016   MCV 95.4 07/17/2016   PLT 204 07/17/2016    BMET    Component Value Date/Time   NA 141 07/17/2016 1920   NA 139 08/01/2014 1624   K 3.9 07/17/2016 1920   K 3.1 (L) 08/01/2014 1624   CL 106 07/17/2016 1920   CL 101 08/01/2014 1624   CO2 27 07/17/2016 1920   CO2 26 08/01/2014 1624   GLUCOSE 110 (H) 07/17/2016 1920   GLUCOSE 100 (H) 08/01/2014 1624   BUN 6 07/17/2016 1920   BUN 6 (L) 08/01/2014 1624   CREATININE 0.65 07/17/2016 1920   CREATININE  0.74 08/01/2014 1624   CALCIUM 9.0 07/17/2016 1920   CALCIUM 8.4 (L) 08/01/2014 1624   GFRNONAA >60 07/17/2016 1920   GFRNONAA >60 08/01/2014 1624   GFRAA >60 07/17/2016 1920   GFRAA >60 08/01/2014 1624   CrCl cannot be calculated (Patient's most recent lab result is older than the maximum 21 days allowed.).  COAG No results found for: INR, PROTIME  Radiology No results found.  Assessment/Plan 1. Varicose veins of leg with pain, right  Recommend:  The patient has large symptomatic varicose veins that are painful and associated with swelling.  I have had a long discussion with the patient regarding  varicose veins and why they cause symptoms.  Patient will begin wearing graduated compression stockings class 1 on a daily basis, beginning first thing in the morning and removing them in the evening. The patient is instructed specifically not to sleep in the stockings.  The patient  will also begin using over-the-counter analgesics such as Motrin 600 mg po TID to help control the symptoms.    In addition, behavioral modification including elevation during the day will be initiated.    Pending the results of these changes the  patient will be reevaluated in three months.   An  ultrasound of the venous system will be obtained.   Further plans will be based on the ultrasound results and whether conservative therapies are successful at eliminating the pain and swelling.   2. Chronic venous insufficiency No surgery or intervention at this point in time.    I have had a long discussion with the patient regarding venous insufficiency and why it  causes symptoms. I have discussed with the patient the chronic skin changes that accompany venous insufficiency and the long term sequela such as infection and ulceration.  Patient will begin wearing graduated compression stockings class 1 (20-30 mmHg) or compression wraps on a daily basis a prescription was given. The patient will put the stockings  on first thing in the morning and removing them in the evening. The patient is instructed specifically not to sleep in the stockings.    In addition, behavioral modification including several periods of elevation of the lower extremities during the day will be continued. I have demonstrated that proper elevation is a position with the ankles at heart level.  The patient is instructed to begin routine exercise, especially walking on a daily basis  Following the review of the ultrasound the patient will follow up in 2-3 months to reassess the degree of swelling and the control that graduated compression stockings or compression wraps  is offering.   The patient can be assessed for a Lymph Pump at that time  3. Pain of right lower extremity See 1&2  4. Essential hypertension Continue antihypertensive medications as already ordered, these medications have been reviewed and there are no changes at this time.   Levora Dredge, MD  04/03/2017 4:54 PM

## 2017-07-03 ENCOUNTER — Ambulatory Visit (INDEPENDENT_AMBULATORY_CARE_PROVIDER_SITE_OTHER): Payer: BLUE CROSS/BLUE SHIELD | Admitting: Vascular Surgery

## 2017-07-03 ENCOUNTER — Ambulatory Visit (INDEPENDENT_AMBULATORY_CARE_PROVIDER_SITE_OTHER): Payer: BLUE CROSS/BLUE SHIELD

## 2017-07-03 ENCOUNTER — Encounter (INDEPENDENT_AMBULATORY_CARE_PROVIDER_SITE_OTHER): Payer: Self-pay | Admitting: Vascular Surgery

## 2017-07-03 VITALS — BP 134/91 | HR 86 | Resp 17 | Ht 66.0 in | Wt 181.0 lb

## 2017-07-03 DIAGNOSIS — I872 Venous insufficiency (chronic) (peripheral): Secondary | ICD-10-CM | POA: Diagnosis not present

## 2017-07-03 DIAGNOSIS — I83811 Varicose veins of right lower extremities with pain: Secondary | ICD-10-CM

## 2017-07-03 DIAGNOSIS — M79604 Pain in right leg: Secondary | ICD-10-CM

## 2017-07-03 DIAGNOSIS — I10 Essential (primary) hypertension: Secondary | ICD-10-CM | POA: Diagnosis not present

## 2017-07-06 NOTE — Progress Notes (Signed)
MRN : [id number]  Andre Wagner is a 56 y.o. ([birth year]) male who presents with chief complaint of  Chief Complaint  Patient presents with  . Venous Insufficiency    3 month u/s follow up  .  History of Present Illness:The patient returns for followup evaluation 3 months after the initial visit. The patient continues to have pain in the lower extremities with dependency. The pain is lessened with elevation. Graduated compression stockings, Class I (20-30 mmHg), have been worn but the stockings do not eliminate the leg pain. Over-the-counter analgesics do not improve the symptoms. The degree of discomfort continues to interfere with daily activities. The patient notes the pain in the legs is causing problems with daily exercise, at the workplace and even with household activities and maintenance such as standing in the kitchen preparing meals and doing dishes.   Venous ultrasound shows normal deep venous system, no evidence of acute or chronic DVT.  Superficial reflux is present in the right GSV  Current Meds  Medication Sig  . albuterol (PROVENTIL HFA;VENTOLIN HFA) 108 (90 Base) MCG/ACT inhaler Inhale into the lungs.  Marland Kitchen amLODipine (NORVASC) 5 MG tablet Take 5 mg by mouth every morning.     Past Medical History:  Diagnosis Date  . Chronic pain disorder   . GERD (gastroesophageal reflux disease)   . Hypertension   . Panic attack    H/O    Past Surgical History:  Procedure Laterality Date  . BACK SURGERY    . FACIAL FRACTURE SURGERY    . FRACTURE SURGERY    . HARVEST BONE GRAFT     from right hip to repair fracture of the mandible  . NASAL SINUS SURGERY    . UMBILICAL HERNIA REPAIR N/A 10/25/2016   Procedure: HERNIA REPAIR UMBILICAL ADULT;  Surgeon: Nadeen Landau, MD;  Location: [facility] ORS;  Service: General;  Laterality: N/A;    Social History Social History  Substance Use Topics  . Smoking status: Current Every Day Smoker    Packs/day: 0.50    Years: 25.00   Types: Cigarettes  . Smokeless tobacco: Never Used  . Alcohol use 1.2 oz/week    2 Glasses of wine per week     Comment: occassionally    Family History No family history on file.  Allergies  Allergen Reactions  . Codeine Itching    Is fine when he takes benadryl with it     REVIEW OF SYSTEMS (Negative unless checked)  Constitutional: [] Weight loss  [] Fever  [] Chills Cardiac: [] Chest pain   [] Chest pressure   [] Palpitations   [] Shortness of breath when laying flat   [] Shortness of breath with exertion. Vascular:  [] Pain in legs with walking   [x] Pain in legs at rest  [] History of DVT   [] Phlebitis   [x] Swelling in legs   [x] Varicose veins   [] Non-healing ulcers Pulmonary:   [] Uses home oxygen   [] Productive cough   [] Hemoptysis   [] Wheeze  [] COPD   [] Asthma Neurologic:  [] Dizziness   [] Seizures   [] History of stroke   [] History of TIA  [] Aphasia   [] Vissual changes   [] Weakness or numbness in arm   [] Weakness or numbness in leg Musculoskeletal:   [] Joint swelling   [] Joint pain   [] Low back pain Hematologic:  [] Easy bruising  [] Easy bleeding   [] Hypercoagulable state   [] Anemic Gastrointestinal:  [] Diarrhea   [] Vomiting  [] Gastroesophageal reflux/heartburn   [] Difficulty swallowing. Genitourinary:  [] Chronic kidney disease   [] Difficult urination  []   Frequent urination   [] Blood in urine Skin:  [] Rashes   [] Ulcers  Psychological:  [] History of anxiety   []  History of major depression.  Physical Examination  Vitals:   07/03/17 1454  BP: (!) 134/91  Pulse: 86  Resp: 17  Weight: 181 lb (82.1 kg)  Height: 5\' 6"  (1.676 m)   Body mass index is 29.21 kg/m. Gen: WD/WN, NAD Head: [place]/AT, No temporalis wasting.  Ear/Nose/Throat: Hearing grossly intact, nares w/o erythema or drainage Eyes: PER, EOMI, sclera nonicteric.  Neck: Supple, no large masses.   Pulmonary:  Good air movement, no audible wheezing bilaterally, no use of accessory muscles.  Cardiac: RRR, no JVD Vascular:Large  varicosities present extensively greater than 10 mm right.  Mild venous stasis changes to the legs bilaterally.  2+ soft pitting edema Vessel Right Left  Radial Palpable Palpable  PT Palpable Palpable  DP Palpable Palpable  Gastrointestinal: Non-distended. No guarding/no peritoneal signs.  Musculoskeletal: M/S 5/5 throughout.  No deformity or atrophy.  Neurologic: CN 2-12 intact. Symmetrical.  Speech is fluent. Motor exam as listed above. Psychiatric: Judgment intact, Mood & affect appropriate for pt's clinical situation. Dermatologic: No rashes or ulcers noted.  No changes consistent with cellulitis. Lymph : No lichenification or skin changes of chronic lymphedema.  CBC Lab Results  Component Value Date   WBC 8.4 07/17/2016   HGB 15.4 07/17/2016   HCT 45.4 07/17/2016   MCV 95.4 07/17/2016   PLT 204 07/17/2016    BMET    Component Value Date/Time   NA 141 07/17/2016 1920   NA 139 08/01/2014 1624   K 3.9 07/17/2016 1920   K 3.1 (L) 08/01/2014 1624   CL 106 07/17/2016 1920   CL 101 08/01/2014 1624   CO2 27 07/17/2016 1920   CO2 26 08/01/2014 1624   GLUCOSE 110 (H) 07/17/2016 1920   GLUCOSE 100 (H) 08/01/2014 1624   BUN 6 07/17/2016 1920   BUN 6 (L) 08/01/2014 1624   CREATININE 0.65 07/17/2016 1920   CREATININE 0.74 08/01/2014 1624   CALCIUM 9.0 07/17/2016 1920   CALCIUM 8.4 (L) 08/01/2014 1624   GFRNONAA >60 07/17/2016 1920   GFRNONAA >60 08/01/2014 1624   GFRAA >60 07/17/2016 1920   GFRAA >60 08/01/2014 1624   CrCl cannot be calculated (Patient's most recent lab result is older than the maximum 21 days allowed.).  COAG No results found for: INR, PROTIME  Radiology No results found.   Assessment/Plan 1. Varicose veins of leg with pain, right Recommend  I have reviewed my previous  discussion with the patient regarding  varicose veins and why they cause symptoms. Patient will continue  wearing graduated compression stockings class 1 on a daily basis, beginning  first thing in the morning and removing them in the evening.    In addition, behavioral modification including elevation during the day was again discussed and this will continue.  The patient has utilized over the counter pain medications and has been exercising.  However, at this time conservative therapy has not alleviated the patient's symptoms of leg pain and swelling  Recommend: laser ablation of the right great saphenous veins to eliminate the symptoms of pain and swelling of the lower extremities caused by the severe superficial venous reflux disease.   2. Chronic venous insufficiency No surgery or intervention at this point in time.    I have had a long discussion with the patient regarding venous insufficiency and why it  causes symptoms. I have discussed with the  patient the chronic skin changes that accompany venous insufficiency and the long term sequela such as infection and ulceration.  Patient will begin wearing graduated compression stockings class 1 (20-30 mmHg) or compression wraps on a daily basis a prescription was given. The patient will put the stockings on first thing in the morning and removing them in the evening. The patient is instructed specifically not to sleep in the stockings.    In addition, behavioral modification including several periods of elevation of the lower extremities during the day will be continued. I have demonstrated that proper elevation is a position with the ankles at heart level.  The patient is instructed to begin routine exercise, especially walking on a daily basis  Following the review of the ultrasound the patient will follow up in 2-3 months to reassess the degree of swelling and the control that graduated compression stockings or compression wraps  is offering.   The patient can be assessed for a Lymph Pump if his swelling becomes problematic  3. Essential hypertension Continue antihypertensive medications as already ordered, these  medications have been reviewed and there are no changes at this time.   4. Pain of right lower extremity See #1&2    Levora DredgeGregory Schnier, MD  07/06/2017 4:51 PM

## 2017-07-14 ENCOUNTER — Other Ambulatory Visit: Payer: Self-pay | Admitting: Gastroenterology

## 2017-07-14 DIAGNOSIS — R945 Abnormal results of liver function studies: Principal | ICD-10-CM

## 2017-07-14 DIAGNOSIS — R7989 Other specified abnormal findings of blood chemistry: Secondary | ICD-10-CM

## 2017-08-04 ENCOUNTER — Other Ambulatory Visit: Payer: Self-pay | Admitting: Gastroenterology

## 2017-08-04 DIAGNOSIS — R945 Abnormal results of liver function studies: Principal | ICD-10-CM

## 2017-08-04 DIAGNOSIS — R7989 Other specified abnormal findings of blood chemistry: Secondary | ICD-10-CM

## 2017-08-07 ENCOUNTER — Ambulatory Visit: Payer: 59

## 2017-08-08 ENCOUNTER — Ambulatory Visit
Admission: RE | Admit: 2017-08-08 | Discharge: 2017-08-08 | Disposition: A | Discharge status: Home / Self Care | Payer: BLUE CROSS/BLUE SHIELD | Source: Ambulatory Visit | Attending: Gastroenterology | Admitting: Gastroenterology

## 2017-08-08 DIAGNOSIS — K760 Fatty (change of) liver, not elsewhere classified: Secondary | ICD-10-CM | POA: Diagnosis not present

## 2017-08-08 DIAGNOSIS — R7989 Other specified abnormal findings of blood chemistry: Secondary | ICD-10-CM | POA: Insufficient documentation

## 2017-08-08 DIAGNOSIS — R945 Abnormal results of liver function studies: Secondary | ICD-10-CM

## 2017-08-08 DIAGNOSIS — K769 Liver disease, unspecified: Secondary | ICD-10-CM | POA: Insufficient documentation

## 2017-08-12 ENCOUNTER — Encounter: Payer: Self-pay | Admitting: *Deleted

## 2017-08-15 ENCOUNTER — Encounter
Admission: RE | Disposition: A | Discharge status: Home / Self Care | Payer: Self-pay | Source: Ambulatory Visit | Attending: Unknown Physician Specialty

## 2017-08-15 ENCOUNTER — Ambulatory Visit
Admission: RE | Admit: 2017-08-15 | Discharge: 2017-08-15 | Disposition: A | Discharge status: Home / Self Care | Payer: BLUE CROSS/BLUE SHIELD | Source: Ambulatory Visit | Attending: Unknown Physician Specialty | Admitting: Unknown Physician Specialty

## 2017-08-15 ENCOUNTER — Ambulatory Visit: Payer: BLUE CROSS/BLUE SHIELD | Admitting: Anesthesiology

## 2017-08-15 DIAGNOSIS — E782 Mixed hyperlipidemia: Secondary | ICD-10-CM | POA: Insufficient documentation

## 2017-08-15 DIAGNOSIS — K219 Gastro-esophageal reflux disease without esophagitis: Secondary | ICD-10-CM | POA: Insufficient documentation

## 2017-08-15 DIAGNOSIS — I8391 Asymptomatic varicose veins of right lower extremity: Secondary | ICD-10-CM | POA: Diagnosis not present

## 2017-08-15 DIAGNOSIS — M23321 Other meniscus derangements, posterior horn of medial meniscus, right knee: Secondary | ICD-10-CM | POA: Diagnosis not present

## 2017-08-15 DIAGNOSIS — F17210 Nicotine dependence, cigarettes, uncomplicated: Secondary | ICD-10-CM | POA: Diagnosis not present

## 2017-08-15 DIAGNOSIS — I10 Essential (primary) hypertension: Secondary | ICD-10-CM | POA: Diagnosis not present

## 2017-08-15 DIAGNOSIS — M1711 Unilateral primary osteoarthritis, right knee: Secondary | ICD-10-CM | POA: Insufficient documentation

## 2017-08-15 HISTORY — PX: KNEE ARTHROSCOPY: SHX127

## 2017-08-15 HISTORY — DX: Adverse effect of unspecified anesthetic, initial encounter: T41.45XA

## 2017-08-15 HISTORY — DX: Other complications of anesthesia, initial encounter: T88.59XA

## 2017-08-15 HISTORY — DX: Asymptomatic varicose veins of unspecified lower extremity: I83.90

## 2017-08-15 HISTORY — DX: Fatty (change of) liver, not elsewhere classified: K76.0

## 2017-08-15 SURGERY — ARTHROSCOPY, KNEE
Anesthesia: General | Site: Knee | Laterality: Right | Wound class: Clean

## 2017-08-15 MED ORDER — KETOROLAC TROMETHAMINE 15 MG/ML IJ SOLN: 15.0000 mg | Freq: Once | INTRAMUSCULAR | Status: DC

## 2017-08-15 MED ORDER — OXYCODONE HCL 5 MG PO TABS
5.0000 mg | ORAL_TABLET | Freq: Once | ORAL | Status: AC
  Administered 2017-08-15: 5 mg via ORAL

## 2017-08-15 MED ORDER — FENTANYL CITRATE (PF) 100 MCG/2ML IJ SOLN
INTRAMUSCULAR | Status: DC | PRN
  Administered 2017-08-15 (×2): 50 ug via INTRAVENOUS

## 2017-08-15 MED ORDER — OXYCODONE HCL 5 MG/5ML PO SOLN: 5.0000 mg | Freq: Once | ORAL | Status: AC | PRN

## 2017-08-15 MED ORDER — LIDOCAINE HCL (CARDIAC) 20 MG/ML IV SOLN
INTRAVENOUS | Status: DC | PRN
  Administered 2017-08-15: 50 mg via INTRATRACHEAL

## 2017-08-15 MED ORDER — BUPIVACAINE HCL (PF) 0.5 % IJ SOLN
INTRAMUSCULAR | Status: DC | PRN
  Administered 2017-08-15: 16 mL

## 2017-08-15 MED ORDER — PROPOFOL 10 MG/ML IV BOLUS
INTRAVENOUS | Status: DC | PRN
  Administered 2017-08-15: 200 mg via INTRAVENOUS

## 2017-08-15 MED ORDER — KETOROLAC TROMETHAMINE 30 MG/ML IJ SOLN
30.0000 mg | Freq: Once | INTRAMUSCULAR | Status: AC
  Administered 2017-08-15: 30 mg via INTRAVENOUS

## 2017-08-15 MED ORDER — LACTATED RINGERS IV SOLN
INTRAVENOUS | Status: DC
  Administered 2017-08-15: 08:00:00 via INTRAVENOUS

## 2017-08-15 MED ORDER — OXYCODONE HCL 5 MG PO TABS
5.0000 mg | ORAL_TABLET | Freq: Once | ORAL | Status: AC | PRN
  Administered 2017-08-15: 5 mg via ORAL

## 2017-08-15 MED ORDER — ONDANSETRON HCL 4 MG/2ML IJ SOLN: 4.0000 mg | Freq: Once | INTRAMUSCULAR | Status: DC | PRN

## 2017-08-15 MED ORDER — ULTRAM 50 MG PO TABS: 50.0000 mg | ORAL_TABLET | Freq: Four times a day (QID) | ORAL | 0 refills | Status: DC | PRN

## 2017-08-15 MED ORDER — MIDAZOLAM HCL 5 MG/5ML IJ SOLN
INTRAMUSCULAR | Status: DC | PRN
  Administered 2017-08-15: 2 mg via INTRAVENOUS

## 2017-08-15 MED ORDER — HYDROMORPHONE HCL 1 MG/ML IJ SOLN
0.2500 mg | INTRAMUSCULAR | Status: DC | PRN
  Administered 2017-08-15: 0.25 mg via INTRAVENOUS
  Administered 2017-08-15: 0.5 mg via INTRAVENOUS
  Administered 2017-08-15: 0.25 mg via INTRAVENOUS
  Administered 2017-08-15: 0.5 mg via INTRAVENOUS

## 2017-08-15 MED ORDER — ACETAMINOPHEN 160 MG/5ML PO SOLN: 325.0000 mg | ORAL | Status: DC | PRN

## 2017-08-15 MED ORDER — ACETAMINOPHEN 325 MG PO TABS: 325.0000 mg | ORAL_TABLET | ORAL | Status: DC | PRN

## 2017-08-15 MED ORDER — FENTANYL CITRATE (PF) 100 MCG/2ML IJ SOLN
25.0000 ug | INTRAMUSCULAR | Status: DC | PRN
  Administered 2017-08-15 (×4): 25 ug via INTRAVENOUS

## 2017-08-15 MED ORDER — ONDANSETRON HCL 4 MG/2ML IJ SOLN
INTRAMUSCULAR | Status: DC | PRN
  Administered 2017-08-15: 4 mg via INTRAVENOUS

## 2017-08-15 SURGICAL SUPPLY — 47 items
ARTHROWAND PARAGON T2 (SURGICAL WAND) ×3
BLADE ABRADER 4.5 (BLADE) IMPLANT
BLADE FULL RADIUS 3.5 (BLADE) ×2 IMPLANT
BLADE SHAVER 4.5X7 STR FR (MISCELLANEOUS) IMPLANT
BNDG ESMARK 6X12 TAN STRL LF (GAUZE/BANDAGES/DRESSINGS) IMPLANT
BUR 5.5 NOTCHBLASTER STR (BURR) IMPLANT
BUR ABRADER 4.0 W/FLUTE AQUA (MISCELLANEOUS) IMPLANT
BUR ABRADER 5.5 BLK (MISCELLANEOUS) IMPLANT
BUR ACROMIONIZER 4.0 (BURR) IMPLANT
BUR BR 5.5 WIDE MOUTH (BURR) IMPLANT
BURR 5.5 NOTCHBLASTER STR (BURR)
BURR ABRADER 4.0 W/FLUTE AQUA (MISCELLANEOUS)
BURR ABRADER 5.5 BLK (MISCELLANEOUS)
CANN 8.5MMX72MM THREADED (CANNULA)
CANNULA THRD 8.5X72 DISP (CANNULA) IMPLANT
COVER LIGHT HANDLE UNIVERSAL (MISCELLANEOUS) ×6 IMPLANT
CUFF TOURN SGL QUICK 24 (TOURNIQUET CUFF) ×3
CUFF TOURN SGL QUICK 30 (MISCELLANEOUS)
CUFF TOURN SGL QUICK 34 (TOURNIQUET CUFF)
CUFF TRNQT CYL 24X4X40X1 (TOURNIQUET CUFF) IMPLANT
CUFF TRNQT CYL 34X4X40X1 (TOURNIQUET CUFF) IMPLANT
CUFF TRNQT CYL LO 30X4X (MISCELLANEOUS) IMPLANT
DRAPE LEGGINS SURG 28X43 STRL (DRAPES) ×3 IMPLANT
DURAPREP 26ML APPLICATOR (WOUND CARE) ×3 IMPLANT
GAUZE SPONGE 4X4 12PLY STRL (GAUZE/BANDAGES/DRESSINGS) ×3 IMPLANT
GLOVE BIO SURGEON STRL SZ7.5 (GLOVE) ×5 IMPLANT
GLOVE BIO SURGEON STRL SZ8 (GLOVE) ×3 IMPLANT
GLOVE INDICATOR 8.0 STRL GRN (GLOVE) ×5 IMPLANT
GOWN STRL REIN 2XL XLG LVL4 (GOWN DISPOSABLE) ×4 IMPLANT
GOWN STRL REUS W/TWL 2XL LVL3 (GOWN DISPOSABLE) ×2 IMPLANT
IV LACTATED RINGER IRRG 3000ML (IV SOLUTION) ×6
IV LR IRRIG 3000ML ARTHROMATIC (IV SOLUTION) ×2 IMPLANT
KIT ROOM TURNOVER OR (KITS) ×3 IMPLANT
MANIFOLD 4PT FOR NEPTUNE1 (MISCELLANEOUS) ×3 IMPLANT
PACK ARTHROSCOPY KNEE (MISCELLANEOUS) ×3 IMPLANT
SET TUBE SUCT SHAVER OUTFL 24K (TUBING) ×3 IMPLANT
SOL PREP PVP 2OZ (MISCELLANEOUS) ×3
SOLUTION PREP PVP 2OZ (MISCELLANEOUS) ×1 IMPLANT
SUT ETHILON 3-0 FS-10 30 BLK (SUTURE) ×3
SUTURE EHLN 3-0 FS-10 30 BLK (SUTURE) ×1 IMPLANT
TAPE MICROFOAM 4IN (TAPE) ×3 IMPLANT
TUBING ARTHRO INFLOW-ONLY STRL (TUBING) ×3 IMPLANT
WAND ARTHRO PARAGON T2 (SURGICAL WAND) IMPLANT
WAND HAND CNTRL MULTIVAC 50 (MISCELLANEOUS) IMPLANT
WAND HAND CNTRL MULTIVAC 90 (MISCELLANEOUS) IMPLANT
WAND MEGAVAC 90 (MISCELLANEOUS) IMPLANT
WRAP KNEE W/COLD PACKS 25.5X14 (SOFTGOODS) ×2 IMPLANT

## 2017-08-15 NOTE — Op Note (Signed)
Patient: Andre Wagner, Andre Wagner.  Preoperative diagnosis: Possible partially torn medial meniscus right knee plus lateral femoral chondral lesion and patellofemoral chondromalacia  Postop diagnosis: Same  Operation: Arthroscopic trephination of partial medial meniscus tear right knee plus debridement and microfracture of lateral femoral chondral lesion  Surgeon: Randon Goldsmith, MD  Anesthesia: Gen.   History: Patient's had a long history of right knee pain.  The plain films revealed no significant pathology .  The patient had an MRI which revealed probable partially torn medial meniscus plus a lateral femoral chondral lesion and patellofemoral chondromalacia The patient was scheduled for surgery due to persistent discomfort despite conservative treatment.  The patient was taken the operating room where satisfactory general anesthesia was achieved. A tourniquet and leg holder were was applied to the right thigh. A well leg support was applied to the nonoperative extremity. The right knee was prepped and draped in usual fashion for an arthroscopic procedure. An inflow cannula was introduced superomedially. The joint was distended with lactated Ringer's. Scope was introduced through an inferolateral puncture wound and a probe through an inferomedial puncture wound. Inspection of the medial compartment revealed  small partial tear of the posterior horn of the medial meniscus. The tear was probably less than a centimeter and was in the peripheral rim of the posterior horn of the medial meniscus. I went ahead and trephinated the small lesion with a spinal needle. I used a basket biter to perform a partial medial meniscectomy along the inner rim of the posterior horn hopefully decrease traction on the partial tear. I then Coblated a small grade 3 chondral lesion in the mid weight-bearing portion of the medial femoral condyle. Inspection of the intercondylar notch revealed intact cruciates. Inspection of the the  lateral compartment revealed a linear grade 3 chondral lesion in the mid weight-bearing portion of the lateral femoral condyle. I debrided this lesion with a small resector and then curetted the lesion to bone. I then used a 45 awl to microfracture the lesion. Next I microfractured the lateral wall of the intercondylar notch to promote a little more bleeding to hopefully help heal the partial medial meniscus tear and the lateral femoral chondral lesion. Trochlear groove was inspected and appeared to be fairly smooth.   Inspection of the retropatellar surface revealed grade 2 fibrillar changes. I observed patella tracking from the superomedial portal. The patella seemed to track fairly well.  The instruments were removed from the joint at this time. The puncture wounds were closed with 3-0 nylon in vertical mattress fashion. I injected each puncture wound with several cc of half percent Marcaine without epinephrine. Betadine was applied to the wounds followed by sterile dressing. An ice pack was applied to the right knee. The patient was awakened and transferred to the stretcher bed. The patient was taken to the recovery room in satisfactory condition.  The tourniquet was not inflated during the course of the procedure. Blood loss was negligible.

## 2017-08-15 NOTE — Transfer of Care (Signed)
Immediate Anesthesia Transfer of Care Note  Patient: CAMBER CHOUDHURY  Procedure(s) Performed: Procedure(s) with comments: RIGHT KNEE ARTHROSCOPY  WITH PARTIAL MEDIAL MENISECTOMY, CHONDROPLASTY AND MICROFRACTURE OF LATERAL FEMORAL CONDYLE (Right) - ROB BAGWELL  Patient Location: PACU  Anesthesia Type: General  Level of Consciousness: awake, alert  and patient cooperative  Airway and Oxygen Therapy: Patient Spontanous Breathing and Patient connected to supplemental oxygen  Post-op Assessment: Post-op Vital signs reviewed, Patient's Cardiovascular Status Stable, Respiratory Function Stable, Patent Airway and No signs of Nausea or vomiting  Post-op Vital Signs: Reviewed and stable  Complications: No apparent anesthesia complications

## 2017-08-15 NOTE — Discharge Instructions (Signed)
General Anesthesia, Adult, Care After These instructions provide you with information about caring for yourself after your procedure. Your health care provider may also give you more specific instructions. Your treatment has been planned according to current medical practices, but problems sometimes occur. Call your health care provider if you have any problems or questions after your procedure. What can I expect after the procedure? After the procedure, it is common to have:  Vomiting.  A sore throat.  Mental slowness.  It is common to feel:  Nauseous.  Cold or shivery.  Sleepy.  Tired.  Sore or achy, even in parts of your body where you did not have surgery.  Follow these instructions at home: For at least 24 hours after the procedure:  Do not: ? Participate in activities where you could fall or become injured. ? Drive. ? Use heavy machinery. ? Drink alcohol. ? Take sleeping pills or medicines that cause drowsiness. ? Make important decisions or sign legal documents. ? Take care of children on your own.  Rest. Eating and drinking  If you vomit, drink water, juice, or soup when you can drink without vomiting.  Drink enough fluid to keep your urine clear or pale yellow.  Make sure you have little or no nausea before eating solid foods.  Follow the diet recommended by your health care provider. General instructions  Have a responsible adult stay with you until you are awake and alert.  Return to your normal activities as told by your health care provider. Ask your health care provider what activities are safe for you.  Take over-the-counter and prescription medicines only as told by your health care provider.  If you smoke, do not smoke without supervision.  Keep all follow-up visits as told by your health care provider. This is important. Contact a health care provider if:  You continue to have nausea or vomiting at home, and medicines are not helpful.  You  cannot drink fluids or start eating again.  You cannot urinate after 8-12 hours.  You develop a skin rash.  You have fever.  You have increasing redness at the site of your procedure. Get help right away if:  You have difficulty breathing.  You have chest pain.  You have unexpected bleeding.  You feel that you are having a life-threatening or urgent problem. This information is not intended to replace advice given to you by your health care provider. Make sure you discuss any questions you have with your health care provider. Document Released: 03/17/2001 Document Revised: 05/13/2016 Document Reviewed: 11/23/2015 Elsevier Interactive Patient Education  2018 [employer]   [facility]  Alda Berthold., M.D. [phone]   KNEE ARTHROSCOPY POST OPERATION INSTRUCTIONS:  PLEASE READ THESE INSTRUCTIONS ABOUT POST OPERATION CARE. THEY WILL ANSWER MOST OF YOUR QUESTIONS.  You have been given a prescription for pain. Please take as directed for pain.  You can walk, keeping the knee slightly stiff-avoid doing too much bending the first day. (if ACL reconstruction is performed, keep brace locked in extension when walking.)  You will use crutches or cane if needed. Can weight bear as tolerated  Plan to take three to four days off from work. You can resume work when you are comfortable. (This can be a week or more, depending on the type of work you do.)  To reduce pain and swelling, place one to two pillows under the knee the first two or three days when sitting or lying. An  An ice pack may be placed on top of the area over the dressing. Instructions for making homemade icepack are as follow:  °Flexible homemade alcohol water ice pack  °2 cups water  °1 cup rubbing alcohol  °food coloring for the blue tint (optional)  °2 zip-top bags - gallon-size  °Mix the water and alcohol together in one of your zip-top bags and add food coloring. Release as  much air as possible and seal the bag. Place in freezer for at least 12 hours.  °The small incisions in your knee are closed with nylon stitches. They will be removed in the office.  °The bulky dressing may be removed in the third day after surgery. (If ACL surgery-DO NOT REMOVE BANDAGES). Put a waterproof band-aid over each stitch. Do not put any creams or ointments on wounds. You may shower at this time, but change waterproof band-aids after showering. KEEP INCISIONS CLEAN AND DRY UNTIL YOU RETURN TO THE OFFICE.  °Sometimes the operative area remains somewhat painful and swollen for several weeks. This is usually nothing to worry about, but call if you have any excessive symptoms, especially fever. It is not unusual to have a low grade fever of 99 degrees for the first few days. If persist after 3-4 days call the office. It is not uncommon for the pain to be a little worse on the third day after surgery.  °Begin doing gentle exercises right away. They will be limited by the amount of pain and swelling you have.  Exercising will reduce the swelling, increase motion, and prevent muscle weakness. Exercises: Straight leg raising and gentle knee bending.  °Take a 325 milligram aspirin or Bufferin tablet twice a day for 2 weeks after meals or milk. This along with elevation will help reduce the possibility of phlebitis in your operated leg.  °Avoid strenuous athletics for a minimum of 4 to 6 weeks after arthroscopic surgery (approximately five months if ACL surgery).  °If the surgery included ACL reconstruction the brace that is supplied to the extremity post surgery is to be locked in extension when you are asleep and is to be locked in extension when you are ambulating. It can be unlocked for exercises or sitting.  °Keep your post surgery appointment that has been made for you. If you do not remember the date call [phone]. Your follow up appointment should be between 7-10 days.  ° ° °

## 2017-08-15 NOTE — Anesthesia Postprocedure Evaluation (Signed)
Anesthesia Post Note  Patient: Andre Wagner  Procedure(s) Performed: Procedure(s) (LRB): RIGHT KNEE ARTHROSCOPY  WITH PARTIAL MEDIAL MENISECTOMY, CHONDROPLASTY AND MICROFRACTURE OF LATERAL FEMORAL CONDYLE (Right)  Patient location during evaluation: PACU Anesthesia Type: General Level of consciousness: awake and alert Pain management: pain level controlled Vital Signs Assessment: post-procedure vital signs reviewed and stable Respiratory status: spontaneous breathing, nonlabored ventilation, respiratory function stable and patient connected to nasal cannula oxygen Cardiovascular status: blood pressure returned to baseline and stable Postop Assessment: no signs of nausea or vomiting Anesthetic complications: no    Alta Corning

## 2017-08-15 NOTE — Anesthesia Preprocedure Evaluation (Signed)
Anesthesia Evaluation  Patient identified by MRN, date of birth, ID band Patient awake    Reviewed: Allergy & Precautions, H&P , NPO status , Patient's Chart, lab work & pertinent test results, reviewed documented beta blocker date and time   Airway Mallampati: I  TM Distance: >3 FB Neck ROM: full    Dental no notable dental hx.    Pulmonary Current Smoker,    Pulmonary exam normal breath sounds clear to auscultation       Cardiovascular Exercise Tolerance: Good hypertension, + Peripheral Vascular Disease  Normal cardiovascular exam Rhythm:regular Rate:Normal     Neuro/Psych negative neurological ROS  negative psych ROS   GI/Hepatic Neg liver ROS, GERD  Medicated and Controlled,  Endo/Other  negative endocrine ROS  Renal/GU negative Renal ROS  negative genitourinary   Musculoskeletal   Abdominal   Peds  Hematology negative hematology ROS (+)   Anesthesia Other Findings   Reproductive/Obstetrics negative OB ROS                             Anesthesia Physical Anesthesia Plan  ASA: II  Anesthesia Plan: General   Post-op Pain Management:    Induction:   PONV Risk Score and Plan:   Airway Management Planned:   Additional Equipment:   Intra-op Plan:   Post-operative Plan:   Informed Consent: I have reviewed the patients History and Physical, chart, labs and discussed the procedure including the risks, benefits and alternatives for the proposed anesthesia with the patient or authorized representative who has indicated his/her understanding and acceptance.   Dental Advisory Given  Plan Discussed with: CRNA and Anesthesiologist  Anesthesia Plan Comments:         Anesthesia Quick Evaluation

## 2017-08-15 NOTE — Anesthesia Procedure Notes (Signed)
Procedure Name: LMA Insertion Date/Time: 08/15/2017 8:42 AM Performed by: Maree Krabbe Pre-anesthesia Checklist: Patient identified, Emergency Drugs available, Suction available, Timeout performed and Patient being monitored Patient Re-evaluated:Patient Re-evaluated prior to induction Oxygen Delivery Method: Circle system utilized Preoxygenation: Pre-oxygenation with 100% oxygen Induction Type: IV induction LMA: LMA inserted LMA Size: 4.0 Number of attempts: 1 Placement Confirmation: positive ETCO2 and breath sounds checked- equal and bilateral Tube secured with: Tape

## 2017-08-15 NOTE — H&P (Signed)
  H and P reviewed. No changes. Uploaded at later date. 

## 2017-08-18 ENCOUNTER — Encounter: Payer: Self-pay | Admitting: Unknown Physician Specialty

## 2017-09-22 ENCOUNTER — Ambulatory Visit: Payer: 59

## 2017-09-24 ENCOUNTER — Telehealth (INDEPENDENT_AMBULATORY_CARE_PROVIDER_SITE_OTHER): Payer: Self-pay

## 2017-09-24 NOTE — Telephone Encounter (Signed)
Patient called stating he his having a laser procedure tomorrow and has not received his Xanax for his procedure. I called into [facility] Xanax #2 no refills for his procedure.

## 2017-09-25 ENCOUNTER — Encounter (INDEPENDENT_AMBULATORY_CARE_PROVIDER_SITE_OTHER): Payer: Self-pay | Admitting: Vascular Surgery

## 2017-09-25 ENCOUNTER — Ambulatory Visit (INDEPENDENT_AMBULATORY_CARE_PROVIDER_SITE_OTHER): Payer: BLUE CROSS/BLUE SHIELD | Admitting: Vascular Surgery

## 2017-09-25 VITALS — BP 126/80 | HR 83 | Resp 16 | Wt 181.2 lb

## 2017-09-25 DIAGNOSIS — I872 Venous insufficiency (chronic) (peripheral): Secondary | ICD-10-CM

## 2017-09-25 DIAGNOSIS — I83811 Varicose veins of right lower extremities with pain: Secondary | ICD-10-CM

## 2017-09-25 NOTE — Progress Notes (Signed)
    MRN : [id number]  Andre Wagner is a 56 y.o. (May 26, 1961) male who presents with chief complaint of painful varicose veins right lower extremity.    The patient's right lower extremity was sterilely prepped and draped.  The ultrasound machine was used to visualize the right saphenous vein throughout its course.  A segment in the distal thigh was selected for access.  The saphenous vein was accessed without difficulty using ultrasound guidance with a micropuncture needle.   An 0.018  wire was placed beyond the saphenofemoral junction through the sheath and the microneedle was removed.  The 65 cm sheath was then placed over the wire and the wire and dilator were removed.  The laser fiber was placed through the sheath and its tip was placed approximately 2 cm below the saphenofemoral junction.  Tumescent anesthesia was then created with a dilute lidocaine solution.  Laser energy was then delivered with constant withdrawal of the sheath and laser fiber.  Approximately 664 Joules of energy were delivered over a length of 17 cm.  Sterile dressings were placed.  The patient tolerated the procedure well without complications.

## 2017-09-26 ENCOUNTER — Other Ambulatory Visit (INDEPENDENT_AMBULATORY_CARE_PROVIDER_SITE_OTHER): Payer: Self-pay | Admitting: Vascular Surgery

## 2017-09-26 DIAGNOSIS — I83891 Varicose veins of right lower extremities with other complications: Secondary | ICD-10-CM

## 2017-09-29 ENCOUNTER — Encounter (INDEPENDENT_AMBULATORY_CARE_PROVIDER_SITE_OTHER): Payer: BLUE CROSS/BLUE SHIELD

## 2017-11-02 ENCOUNTER — Emergency Department
Admission: EM | Admit: 2017-11-02 | Discharge: 2017-11-03 | Disposition: A | Discharge status: Home / Self Care | Payer: BLUE CROSS/BLUE SHIELD | Attending: Emergency Medicine | Admitting: Emergency Medicine

## 2017-11-02 DIAGNOSIS — K649 Unspecified hemorrhoids: Secondary | ICD-10-CM | POA: Diagnosis not present

## 2017-11-02 DIAGNOSIS — F17210 Nicotine dependence, cigarettes, uncomplicated: Secondary | ICD-10-CM | POA: Insufficient documentation

## 2017-11-02 DIAGNOSIS — Z79899 Other long term (current) drug therapy: Secondary | ICD-10-CM | POA: Insufficient documentation

## 2017-11-02 DIAGNOSIS — I10 Essential (primary) hypertension: Secondary | ICD-10-CM | POA: Diagnosis not present

## 2017-11-02 DIAGNOSIS — K625 Hemorrhage of anus and rectum: Secondary | ICD-10-CM | POA: Insufficient documentation

## 2017-11-02 LAB — COMPREHENSIVE METABOLIC PANEL
ALT: 35 U/L (ref 17–63)
ANION GAP: 12 (ref 5–15)
AST: 94 U/L — AB (ref 15–41)
Albumin: 3.4 g/dL — ABNORMAL LOW (ref 3.5–5.0)
Alkaline Phosphatase: 147 U/L — ABNORMAL HIGH (ref 38–126)
BILIRUBIN TOTAL: 0.5 mg/dL (ref 0.3–1.2)
BUN: <5 mg/dL — ABNORMAL LOW (ref 6–20)
CHLORIDE: 104 mmol/L (ref 101–111)
CO2: 24 mmol/L (ref 22–32)
Calcium: 8.8 mg/dL — ABNORMAL LOW (ref 8.9–10.3)
Creatinine, Ser: 0.51 mg/dL — ABNORMAL LOW (ref 0.61–1.24)
Glucose, Bld: 156 mg/dL — ABNORMAL HIGH (ref 65–99)
POTASSIUM: 3.6 mmol/L (ref 3.5–5.1)
Sodium: 140 mmol/L (ref 135–145)
TOTAL PROTEIN: 7.7 g/dL (ref 6.5–8.1)

## 2017-11-02 LAB — CBC WITH DIFFERENTIAL/PLATELET
BASOS PCT: 1 %
Basophils Absolute: 0.1 10*3/uL (ref 0–0.1)
Eosinophils Absolute: 0.1 10*3/uL (ref 0–0.7)
Eosinophils Relative: 1 %
HEMATOCRIT: 45 % (ref 40.0–52.0)
HEMOGLOBIN: 14.4 g/dL (ref 13.0–18.0)
LYMPHS PCT: 17 %
Lymphs Abs: 2.1 10*3/uL (ref 1.0–3.6)
MCH: 28.9 pg (ref 26.0–34.0)
MCHC: 32 g/dL (ref 32.0–36.0)
MCV: 90.5 fL (ref 80.0–100.0)
MONO ABS: 0.9 10*3/uL (ref 0.2–1.0)
MONOS PCT: 8 %
NEUTROS ABS: 8.6 10*3/uL — AB (ref 1.4–6.5)
NEUTROS PCT: 73 %
Platelets: 348 10*3/uL (ref 150–440)
RBC: 4.98 MIL/uL (ref 4.40–5.90)
RDW: 14.7 % — AB (ref 11.5–14.5)
WBC: 11.9 10*3/uL — ABNORMAL HIGH (ref 3.8–10.6)

## 2017-11-02 LAB — CBC
HEMATOCRIT: 44.8 % (ref 40.0–52.0)
HEMOGLOBIN: 14.6 g/dL (ref 13.0–18.0)
MCH: 29.2 pg (ref 26.0–34.0)
MCHC: 32.6 g/dL (ref 32.0–36.0)
MCV: 89.7 fL (ref 80.0–100.0)
Platelets: 353 10*3/uL (ref 150–440)
RBC: 4.99 MIL/uL (ref 4.40–5.90)
RDW: 14.6 % — ABNORMAL HIGH (ref 11.5–14.5)
WBC: 10.8 10*3/uL — AB (ref 3.8–10.6)

## 2017-11-02 MED ORDER — LIDOCAINE HCL 2 % EX GEL
1.0000 "application " | Freq: Once | CUTANEOUS | Status: AC
  Administered 2017-11-02: 1 via TOPICAL

## 2017-11-02 MED ORDER — LIDOCAINE HCL 2 % EX GEL
CUTANEOUS | Status: AC
  Filled 2017-11-02: qty 10

## 2017-11-02 MED ORDER — LIDOCAINE (ANORECTAL) 5 % EX GEL: CUTANEOUS | 0 refills | Status: DC

## 2017-11-02 MED ORDER — KETOROLAC TROMETHAMINE 30 MG/ML IJ SOLN
30.0000 mg | Freq: Once | INTRAMUSCULAR | Status: AC
  Administered 2017-11-02: 30 mg via INTRAMUSCULAR
  Filled 2017-11-02: qty 1

## 2017-11-02 MED ORDER — IBUPROFEN 600 MG PO TABS: 600.0000 mg | ORAL_TABLET | Freq: Four times a day (QID) | ORAL | 0 refills | Status: DC | PRN

## 2017-11-02 MED ORDER — MORPHINE SULFATE (PF) 4 MG/ML IV SOLN
4.0000 mg | Freq: Once | INTRAVENOUS | Status: AC
  Administered 2017-11-03: 4 mg via INTRAMUSCULAR
  Filled 2017-11-02: qty 1

## 2017-11-02 NOTE — ED Triage Notes (Signed)
Pt states rectal bleeding off and on for months. Pt states today since 0300 has had bright red rectal bleeding and rectal pain. Pt states he has two large lumps noted around rectum. Pt states is painful to sit.

## 2017-11-02 NOTE — ED Triage Notes (Signed)
Patient ambulating to stat desk asking for update on wait time. Patient given update. Patient no acute distress at this time.

## 2017-11-02 NOTE — ED Provider Notes (Addendum)
[facility] Emergency Department Provider Note ____________________________________________   First MD Initiated Contact with Patient 11/02/17 2210     (approximate)  I have reviewed the triage vital signs and the nursing notes.   HISTORY  Chief Complaint Rectal Bleeding    HPI Andre Wagner is a 56 y.o. male with past medical history as noted below who presents with rectal bleeding, acute onset in the last several days, no movements, described as bright red blood, and associated today with pain and a palpable mass next to the anus.  Patient reports being told he has hemorrhoids before, and had a colonoscopy in the past.  Symptoms are not relieved by Preparation H.  Patient denies lightheadedness, dizziness, or shortness of breath.  No abdominal pain.  Past Medical History:  Diagnosis Date  . Chronic pain disorder   . Complication of anesthesia    has woken up during IV anesthesia  . Fatty liver   . GERD (gastroesophageal reflux disease)   . Hypertension   . Panic attack    H/O  . Varicose vein of leg    right    Patient Active Problem List   Diagnosis Date Noted  . Varicose veins of leg with pain, right 04/03/2017  . Chronic venous insufficiency 04/03/2017  . Leg pain 04/03/2017  . Essential hypertension 04/03/2017    Past Surgical History:  Procedure Laterality Date  . BACK SURGERY    . FACIAL FRACTURE SURGERY    . FRACTURE SURGERY    . HARVEST BONE GRAFT     from right hip to repair fracture of the mandible  . NASAL SINUS SURGERY      Prior to Admission medications   Medication Sig Start Date End Date Taking? Authorizing Provider  albuterol (PROVENTIL HFA;VENTOLIN HFA) 108 (90 Base) MCG/ACT inhaler Inhale into the lungs. 02/12/17 02/12/18  [provider]  amLODipine (NORVASC) 5 MG tablet Take 5 mg by mouth every morning.  10/08/16 10/08/17  [provider]  ibuprofen (ADVIL,MOTRIN) 600 MG tablet Take 1 tablet (600 mg  total) every 6 (six) hours as needed by mouth for moderate pain. 11/02/17   Dionne BucySiadecki, Atira Borello, MD  Lidocaine, Anorectal, 5 % GEL Apply 2mL topically to affected area 3 times daily as needed for pain. 11/02/17   Dionne BucySiadecki, Daneesha Quinteros, MD  pantoprazole (PROTONIX) 40 MG tablet Take 40 mg by mouth daily as needed.    [provider]  ULTRAM 50 MG tablet Take 1 tablet (50 mg total) by mouth every 6 (six) hours as needed for moderate pain. 08/15/17   Erin SonsKernodle, Harold, MD    Allergies Codeine  No family history on file.  Social History Social History   Tobacco Use  . Smoking status: Current Every Day Smoker    Packs/day: 0.50    Years: 25.00    Pack years: 12.50    Types: Cigarettes  . Smokeless tobacco: Never Used  Substance Use Topics  . Alcohol use: Yes    Alcohol/week: 3.0 oz    Types: 5 Glasses of wine per week    Comment: occassionally  . Drug use: No    Comment: denies during phone interview on 10-17-16 but UDS was + for cocaine in 2015    Review of Systems  Constitutional: No fever. Eyes: No redness. ENT: No sore throat. Cardiovascular: Denies chest pain. Respiratory: Denies shortness of breath. Gastrointestinal: Positive for rectal bleeding.  Genitourinary: Negative for hematuria.  Musculoskeletal: Negative for back pain. Skin: Negative for  rash. Neurological: Negative for headache.   ____________________________________________   PHYSICAL EXAM:  VITAL SIGNS: ED Triage Vitals  Enc Vitals Group     BP 11/02/17 1904 137/73     Pulse Rate 11/02/17 1904 72     Resp 11/02/17 1904 16     Temp 11/02/17 1904 98.8 F (37.1 C)     Temp Source 11/02/17 1904 Oral     SpO2 11/02/17 1904 96 %     Weight 11/02/17 1904 170 lb (77.1 kg)     Height 11/02/17 1904 5\' 6"  (1.676 m)     Head Circumference --      Peak Flow --      Pain Score 11/02/17 1903 8     Pain Loc --      Pain Edu? --      Excl. in GC? --     Constitutional: Alert and oriented. Well  appearing and in no acute distress. Eyes: Conjunctivae are normal.  Head: Atraumatic. Nose: No congestion/rhinnorhea. Mouth/Throat: Mucous membranes are moist.   Neck: Normal range of motion.  Cardiovascular:  Good peripheral circulation. Respiratory: Normal respiratory effort.   Gastrointestinal: No distention.  Genitourinary: No active rectal bleeding.  Large nonthrombosed R sided approx 2cm hemorrhoid, with no active bleeding. Musculoskeletal: Extremities warm and well perfused.  Neurologic:  No gross focal neurologic deficits are appreciated.  Skin:  Skin is warm and dry. No rash noted. Psychiatric: Mood and affect are normal. Speech and behavior are normal.  ____________________________________________   LABS (all labs ordered are listed, but only abnormal results are displayed)  Labs Reviewed  COMPREHENSIVE METABOLIC PANEL - Abnormal; Notable for the following components:      Result Value   Glucose, Bld 156 (*)    BUN <5 (*)    Creatinine, Ser 0.51 (*)    Calcium 8.8 (*)    Albumin 3.4 (*)    AST 94 (*)    Alkaline Phosphatase 147 (*)    All other components within normal limits  CBC - Abnormal; Notable for the following components:   WBC 10.8 (*)    RDW 14.6 (*)    All other components within normal limits  CBC WITH DIFFERENTIAL/PLATELET - Abnormal; Notable for the following components:   WBC 11.9 (*)    RDW 14.7 (*)    Neutro Abs 8.6 (*)    All other components within normal limits  POC OCCULT BLOOD, ED  TYPE AND SCREEN   ____________________________________________  EKG   ____________________________________________  RADIOLOGY    ____________________________________________   PROCEDURES  Procedure(s) performed: No    Critical Care performed: No ____________________________________________   INITIAL IMPRESSION / ASSESSMENT AND PLAN / ED COURSE  Pertinent labs & imaging results that were available during my care of the patient were reviewed  by me and considered in my medical decision making (see chart for details).  56 year old male with prior history of rectal bleeding with hemorrhoids presents with bright red blood per rectum over the last 2 days, associated with acute pain and a mass next to the anus.  Review of past medical records in Epic is noncontributory.  On exam, patient is well-appearing, vital signs are normal, and the only pertinent exam finding is a large nonthrombosed hemorrhoid with no active bleeding.  Initial lab workup is within normal limits.  Presentation is consistent with hemorrhoidal bleeding.  Low suspicion for other lower GI source.  We will treat symptomatically with NSAIDs, topical anesthetic, and repeat CBC to eval for  any change in hgb.  If no acute drop in hgb, likely d/c home with surgery followup.   Clinical Course as of Nov 04 31  Wynelle LinkSun Nov 02, 2017  2355 Mild LFT abnormalities, no significant change from baseline.   [SS]  2356 Hgb stable.   [SS]    Clinical Course User Index [SS] Dionne BucySiadecki, Ephraim Reichel, MD   ----------------------------------------- 11:54 PM on 11/02/2017 -----------------------------------------  Patient reports minimal improvement in pain.  Will give additional dose of analgesia.  Repeat CBC shows stable hemoglobin.  No evidence of significant blood loss.  We will discharge with prescription for topical lidocaine, analgesia, and give surgery referral.  Patient feels comfortable with the discharge plan.  Return precautions given, and patient expresses understanding.  ____________________________________________   FINAL CLINICAL IMPRESSION(S) / ED DIAGNOSES  Final diagnoses:  Rectal bleeding  Hemorrhoids, unspecified hemorrhoid type      NEW MEDICATIONS STARTED DURING THIS VISIT:  This SmartLink is deprecated. Use AVSMEDLIST instead to display the medication list for a patient.   Note:  This document was prepared using Dragon voice recognition software and may  include unintentional dictation errors.    Dionne BucySiadecki, Avante Carneiro, MD 11/02/17 Phineas [id number]    Dionne BucySiadecki, Tully Burgo, MD 11/03/17 [phone]

## 2017-11-03 NOTE — Discharge Instructions (Signed)
Take the ibuprofen and use the topical lidocaine as needed for pain.  Return to the emergency department for new or worsening pain, new, worsening, or persistent bleeding, weakness or lightheadedness, or any other new or worsening symptoms that concern you.  You can follow-up with your primary care doctor, and with also also have provided a referral to a general surgeon in the area for further evaluation.

## 2017-11-04 ENCOUNTER — Ambulatory Visit: Payer: Self-pay | Admitting: General Surgery

## 2017-11-04 NOTE — H&P (Signed)
PATIENT PROFILE: Andre Wagner is a 56 y.o. male who presents to the Clinic for consultation for evaluation of hemorrhoids.  PCP:  Andre Bergamo, MD  HISTORY OF PRESENT ILLNESS: Andre Wagner comes to the clinic for evaluation of hemorrhoids. The patient refers that 3 days ago when he was sitting on the toilet, the patient felt a sharp pain on the perianal area and saw some bleeding . After that the pain was so intense that he decided to go the ER. Pain was on the anal area, does not radiates to other part of the body. Nothing help to improve the pain. The pain has decreased slowly being 8/10 when it started and now 6/10. Sitting down for a prolonged time and certain positions makes the pain worse. Denies fever or chills.    PROBLEM LIST:         Problem List  Date Reviewed: 09/24/2017         Noted   Primary osteoarthritis of right knee 08/12/2017   Chronic pain of right knee 08/07/2017   Alkaline phosphatase elevation 07/10/2017   Elevated LFTs 07/10/2017   Hyperlipidemia, mixed 07/10/2017   Ganglion cyst of wrist, right 07/10/2017   Need for vaccination 07/10/2017   Screen for colon cancer 07/10/2017   Varicose veins of right lower extremity 03/31/2017   Complex tear of medial meniscus of right knee, unspecified whether old or current tear, initial encounter 03/28/2017   Umbilical hernia without obstruction and without gangrene 10/08/2016   Essential hypertension 10/08/2016   Rectal bleeding 10/08/2016   History of peptic ulcer disease 02/16/2013   Tobacco abuse Unknown   GERD (gastroesophageal reflux disease) Unknown   Anxiety, unspecified Unknown   Depression, unspecified Unknown   History of Dependency on pain medication Unknown   Chronic pain 12/10/2012      GENERAL REVIEW OF SYSTEMS:   General ROS: negative for - chills, fatigue, fever, weight gain or weight loss Allergy and Immunology ROS: negative for - hives  Hematological and Lymphatic ROS:  negative for - bleeding problems or bruising, negative for palpable nodes Endocrine ROS: negative for - heat or cold intolerance, hair changes Respiratory ROS: negative for - cough, shortness of breath or wheezing Cardiovascular ROS: no chest pain or palpitations GI ROS: negative for nausea, vomiting, abdominal pain, diarrhea, constipation Musculoskeletal ROS: negative for - joint swelling or muscle pain Neurological ROS: negative for - confusion, syncope Dermatological ROS: negative for pruritus and rash  MEDICATIONS: CurrentMedications        Current Outpatient Medications  Medication Sig Dispense Refill  . albuterol 90 mcg/actuation inhaler Inhale 2 inhalations into the lungs every 6 (six) hours as needed for Wheezing. 1 Inhaler 0  . amLODIPine (NORVASC) 5 MG tablet TAKE ONE TABLET BY MOUTH EVERY DAY 30 tablet 8  . atorvastatin (LIPITOR) 20 MG tablet Take 1 tablet (20 mg total) by mouth once daily. 30 tablet 11  . meloxicam (MOBIC) 7.5 MG tablet Take 7.5 mg by mouth once daily  0  . pantoprazole (PROTONIX) 40 MG DR tablet Take 1 tablet (40 mg total) by mouth once daily. Take 15-20 mins before meal 30 tablet 11   No current facility-administered medications for this visit.       ALLERGIES: Codeine  PAST MEDICAL HISTORY:     Past Medical History:  Diagnosis Date  . Anxiety, unspecified   . Chronic pain 12/10/2012  . Dependency on pain medication (CMS-HCC)    Previous History of use -quit January, 2013  .  Depression, unspecified   . Essential hypertension 10/08/2016  . Ganglion cyst of wrist, right 07/10/2017  . GERD (gastroesophageal reflux disease)   . History of peptic ulcer disease 02/16/2013  . Hyperlipidemia, mixed 07/10/2017  . Rectal bleeding 10/08/2016  . Tobacco abuse   . Umbilical hernia without obstruction and without gangrene 10/08/2016  . Varicose veins of right lower extremity 03/31/2017    PAST SURGICAL HISTORY:      Past Surgical History:   Procedure Laterality Date  .  mandibular surgery Left    With metal plate fixation  . Back surgery to remove benign mass     Upper back between shoulder blades  . COLONOSCOPY    . Diskectomy     L5-S1  . FUNCTIONAL ENDOSCOPIC SINUS SURGERY Left   . Hip bone harvest Right    To repair fracture of the mandible  . KNEE ARTHROSCOPY Right 08/15/2017   Trephination of partial medial meniscustear, debridement and microfracture of lateral femoral chondral lesion  . UMBILICAL HERNIA REPAIR  10/25/2016   Dr Renda RollsWilton Smith  . UPPER GASTROINTESTINAL ENDOSCOPY       FAMILY HISTORY:      Family History  Problem Relation Age of Onset  . COPD Mother   . Bone cancer Father   . Coronary Artery Disease (Blocked arteries around heart) Maternal Grandmother   . Stroke Maternal Grandmother   . Colon polyps Maternal Grandfather 62       Twice with colon cancer  . Coronary Artery Disease (Blocked arteries around heart) Maternal Grandfather   . Colon cancer Maternal Grandfather      SOCIAL HISTORY: Social History        Socioeconomic History  . Marital status: Single    Spouse name: None  . Number of children: 0  . Years of education: None  . Highest education level: None  Social Needs  . Financial resource strain: None  . Food insecurity - worry: None  . Food insecurity - inability: None  . Transportation needs - medical: None  . Transportation needs - non-medical: None  Occupational History  . Occupation: Forensic psychologistroduction line worker  Tobacco Use  . Smoking status: Current Every Day Smoker    Packs/day: 0.50    Years: 30.00    Pack years: 15.00    Types: Cigarettes  . Smokeless tobacco: Never Used  Substance and Sexual Activity  . Alcohol use: Yes    Alcohol/week: 3.6 oz    Types: 6 Cans of beer per week    Comment: per week  . Drug use: No  . Sexual activity: Not Currently    Partners: Female    Birth control/protection: Condom   Other Topics Concern  . None  Social History Narrative   Lives alone, he smokes cigarettes, he is not physically active, reports between 3 to 6 alcoholic drinks a week, denies the use of illegal or recreational drugs.      PHYSICAL EXAM:    Vitals:   11/04/17 1048  BP: 132/88  Pulse: (!) 113  Temp: 37.4 C (99.3 F)   Body mass index is 29.05 kg/m. Weight: 81.6 kg (180 lb)   General Appearance:    Alert, cooperative, no distress, appears stated age  Head:     Atraumatic, normocephalic  Eyes:   Anciteric, no erythema, no secretions  Neck:   Supple, symmetrical, no JVD, no palpable lymph nodes  Mouth:   Lips, mucosa, and tongue normal;   Lungs:     Clear  to auscultation bilaterally, respirations unlabored   Heart:    Regular rate and rhythm, S1 and S2 normal, no murmur, rub   or gallop  Abdomen:  Rectal:      Soft, non-tender, bowel sounds active all four quadrants,    no masses, no organomegaly   Thrombosed external hemorrhoid, no bleeding, unable to do finger rectal exam of internal hemorrhoid due to pain.   Extremities:   Extremities normal, atraumatic, no cyanosis or edema  Skin:   Skin color, texture, turgor normal, no rashes or lesions   Neurologic:   Grossly intact.   REVIEW OF DATA: I have reviewed the following data today:      No visits with results within 3 Month(s) from this visit.  Latest known visit with results is:  Office Visit on 07/25/2017  Component Date Value  . Hepatitis B Surface Anti* 07/25/2017 Reactive*  . Hepatitis B Surface Anti* 07/25/2017 >1,000      ASSESSMENT: Mr. [name] is a 56 y.o. male presenting for consultation for hemorroids. Patient with internal and external thrombosed hemorrhoid, subacute at the moment of evaluation. Pain has decreased slowly. Patient will need rectal exam under anesthesia with hemorrhoidectomy. Patient oriented about the diagnosis of hemorrhoid, its implication and its treatment alternatives. Patient  wants surgical management due to the pain. Hemorrhoidectomy procedure was described to patient and the risk of gas and/or fecal incontinence, urinary retention, pain, and stricture were explained to patient.  Patient oriented about using an enema the night before the procedure.   PLAN: 1. Anorectal exam under anesthesia CPT: 45990 2. Internal and External Hemorrhoidectomy Complex CPT: 46260 3. Do not take aspirin or its derivatives 5-7 days before and after surgery 4. CBC, CMP, EKG 5. Follow up pre admission instruction   Patient verbalized understanding, all questions were answered, and were agreeable with the plan outlined above.   Carolan ShiverEdgardo Cintron-Diaz, MD  Electronically signed by Carolan ShiverEdgardo Cintron-Diaz, MD

## 2017-11-06 ENCOUNTER — Encounter
Admission: RE | Admit: 2017-11-06 | Discharge: 2017-11-06 | Disposition: A | Discharge status: Home / Self Care | Payer: BLUE CROSS/BLUE SHIELD | Source: Ambulatory Visit | Attending: General Surgery | Admitting: General Surgery

## 2017-11-06 ENCOUNTER — Other Ambulatory Visit: Payer: Self-pay

## 2017-11-06 DIAGNOSIS — I10 Essential (primary) hypertension: Secondary | ICD-10-CM | POA: Insufficient documentation

## 2017-11-06 DIAGNOSIS — R9431 Abnormal electrocardiogram [ECG] [EKG]: Secondary | ICD-10-CM | POA: Insufficient documentation

## 2017-11-06 MED ORDER — FLEET ENEMA 7-19 GM/118ML RE ENEM
1.0000 | ENEMA | Freq: Once | RECTAL | Status: DC
  Filled 2017-11-06: qty 1

## 2017-11-06 NOTE — Patient Instructions (Signed)
Your procedure is scheduled on: Mon. 11/19 Report to Day Surgery. To find out your arrival time please call [phone] between 1PM - 3PM on Friday 11/16  Remember: Instructions that are not followed completely may result in serious medical risk, up to and including death, or upon the discretion of your surgeon and anesthesiologist your surgery may need to be rescheduled.     _X__ 1. Do not eat food after midnight the night before your procedure.                 No gum chewing or hard candies. You may drink clear liquids up to 2 hours                 before you are scheduled to arrive for your surgery- DO not drink clear                 liquids within 2 hours of the start of your surgery.                 Clear Liquids include:  water, apple juice without pulp, clear carbohydrate                 drink such as Clearfast of Gartorade, Black Coffee or Tea (Do not add                 anything to coffee or tea).     _X__ 2.  No Alcohol for 24 hours before or after surgery.   _X__ 3.  Do Not Smoke or use e-cigarettes For 24 Hours Prior to Your Surgery.                 Do not use any chewable tobacco products for at least 6 hours prior to                 surgery.  ____  4.  Bring all medications with you on the day of surgery if instructed.   __x__  5.  Notify your doctor if there is any change in your medical condition      (cold, fever, infections).     Do not wear jewelry, make-up, hairpins, clips or nail polish. Do not wear lotions, powders, or perfumes. You may wear deodorant. Do not shave 48 hours prior to surgery. Men may shave face and neck. Do not bring valuables to the hospital.    [facility] any belongings or valuables.  Contacts, dentures or bridgework may not be worn into surgery. Leave your suitcase in the car. After surgery it may be brought to your room. For patients admitted to the hospital, discharge time is determined by  your treatment team.   Patients discharged the day of surgery will not be allowed to drive home.   Please read over the following fact sheets that you were given:     _x___ Take these medicines the morning of surgery with A SIP OF WATER:    1. amLODipine (NORVASC) 5 MG tablet  2.   3.   4.  5.  6.  ____ Fleet Enema (as directed)   ____ Use CHG Soap as directed  ____ Use inhalers on the day of surgery  ____ Stop metformin 2 days prior to surgery    ____ Take 1/2 of usual insulin dose the night before surgery. No insulin the morning          of surgery.   ____ Stop Coumadin/Plavix/aspirin on  _x___ Stop Anti-inflammatories on ibuprofen (ADVIL,MOTRIN) 600 MG tablet,meloxicam (MOBIC) 7.5 MG tablet today   ____ Stop supplements until after surgery.    ____ Bring C-Pap to the hospital.

## 2017-11-09 MED ORDER — METRONIDAZOLE IN NACL 5-0.79 MG/ML-% IV SOLN
500.0000 mg | INTRAVENOUS | Status: AC
  Filled 2017-11-09: qty 100

## 2017-11-09 MED ORDER — CIPROFLOXACIN IN D5W 400 MG/200ML IV SOLN: 400.0000 mg | INTRAVENOUS | Status: AC

## 2017-11-10 ENCOUNTER — Encounter: Payer: Self-pay | Admitting: Anesthesiology

## 2017-11-10 ENCOUNTER — Ambulatory Visit
Admission: RE | Admit: 2017-11-10 | Discharge: 2017-11-10 | Disposition: A | Discharge status: Home / Self Care | Payer: BLUE CROSS/BLUE SHIELD | Source: Ambulatory Visit | Attending: General Surgery | Admitting: General Surgery

## 2017-11-10 ENCOUNTER — Encounter: Payer: Self-pay | Admitting: *Deleted

## 2017-11-10 ENCOUNTER — Other Ambulatory Visit: Payer: Self-pay

## 2017-11-10 ENCOUNTER — Encounter
Admission: RE | Disposition: A | Discharge status: Home / Self Care | Payer: Self-pay | Source: Ambulatory Visit | Attending: General Surgery

## 2017-11-10 DIAGNOSIS — Z5309 Procedure and treatment not carried out because of other contraindication: Secondary | ICD-10-CM | POA: Insufficient documentation

## 2017-11-10 LAB — URINE DRUG SCREEN, QUALITATIVE (ARMC ONLY)
Amphetamines, Ur Screen: NOT DETECTED
BARBITURATES, UR SCREEN: NOT DETECTED
BENZODIAZEPINE, UR SCRN: NOT DETECTED
CANNABINOID 50 NG, UR ~~LOC~~: NOT DETECTED
COCAINE METABOLITE, UR ~~LOC~~: POSITIVE — AB
MDMA (Ecstasy)Ur Screen: NOT DETECTED
METHADONE SCREEN, URINE: NOT DETECTED
Opiate, Ur Screen: NOT DETECTED
Phencyclidine (PCP) Ur S: NOT DETECTED
TRICYCLIC, UR SCREEN: NOT DETECTED

## 2017-11-10 LAB — GLUCOSE, CAPILLARY: GLUCOSE-CAPILLARY: 153 mg/dL — AB (ref 65–99)

## 2017-11-10 SURGERY — EXAM UNDER ANESTHESIA, RECTUM
Anesthesia: Choice

## 2017-11-10 MED ORDER — FAMOTIDINE 20 MG PO TABS
20.0000 mg | ORAL_TABLET | Freq: Once | ORAL | Status: AC
  Administered 2017-11-10: 20 mg via ORAL

## 2017-11-10 MED ORDER — PROPOFOL 10 MG/ML IV BOLUS
INTRAVENOUS | Status: AC
  Filled 2017-11-10: qty 20

## 2017-11-10 MED ORDER — MIDAZOLAM HCL 2 MG/2ML IJ SOLN
INTRAMUSCULAR | Status: AC
  Filled 2017-11-10: qty 2

## 2017-11-10 MED ORDER — FAMOTIDINE 20 MG PO TABS
ORAL_TABLET | ORAL | Status: AC
  Administered 2017-11-10: 20 mg via ORAL
  Filled 2017-11-10: qty 1

## 2017-11-10 MED ORDER — FENTANYL CITRATE (PF) 250 MCG/5ML IJ SOLN
INTRAMUSCULAR | Status: AC
  Filled 2017-11-10: qty 5

## 2017-11-10 MED ORDER — SEVOFLURANE IN SOLN
RESPIRATORY_TRACT | Status: AC
  Filled 2017-11-10: qty 250

## 2017-11-10 MED ORDER — SODIUM CHLORIDE 0.9 % IV SOLN: INTRAVENOUS | Status: DC

## 2017-11-10 MED ORDER — CIPROFLOXACIN IN D5W 400 MG/200ML IV SOLN
INTRAVENOUS | Status: AC
  Filled 2017-11-10: qty 200

## 2017-11-10 SURGICAL SUPPLY — 31 items
BLADE SURG 15 STRL LF DISP TIS (BLADE) ×1 IMPLANT
BLADE SURG 15 STRL SS (BLADE) ×3
CANISTER SUCT 1200ML W/VALVE (MISCELLANEOUS) ×3 IMPLANT
DRAPE LAPAROTOMY 100X77 ABD (DRAPES) ×3 IMPLANT
DRAPE LEGGINS SURG 28X43 STRL (DRAPES) ×3 IMPLANT
DRAPE UNDER BUTTOCK W/FLU (DRAPES) ×3 IMPLANT
ELECT REM PT RETURN 9FT ADLT (ELECTROSURGICAL) ×3
ELECTRODE REM PT RTRN 9FT ADLT (ELECTROSURGICAL) ×1 IMPLANT
GAUZE SPONGE 4X4 12PLY STRL (GAUZE/BANDAGES/DRESSINGS) ×3 IMPLANT
GLOVE BIO SURGEON STRL SZ7.5 (GLOVE) ×3 IMPLANT
GOWN STRL REUS W/ TWL LRG LVL3 (GOWN DISPOSABLE) ×2 IMPLANT
GOWN STRL REUS W/TWL LRG LVL3 (GOWN DISPOSABLE) ×6
LABEL OR SOLS (LABEL) ×3 IMPLANT
NDL HYPO 25X1 1.5 SAFETY (NEEDLE) ×1 IMPLANT
NEEDLE HYPO 25X1 1.5 SAFETY (NEEDLE) ×3 IMPLANT
NS IRRIG 500ML POUR BTL (IV SOLUTION) ×3 IMPLANT
PACK BASIN MINOR ARMC (MISCELLANEOUS) ×3 IMPLANT
PAD ABD DERMACEA PRESS 5X9 (GAUZE/BANDAGES/DRESSINGS) ×3 IMPLANT
PAD PREP 24X41 OB/GYN DISP (PERSONAL CARE ITEMS) ×3 IMPLANT
SHEARS HARMONIC 9CM CVD (BLADE) ×3 IMPLANT
SOL PREP PVP 2OZ (MISCELLANEOUS) ×3
SOLUTION PREP PVP 2OZ (MISCELLANEOUS) ×1 IMPLANT
STAPLER PROXIMATE HCS (STAPLE) IMPLANT
STRAP SAFETY BODY (MISCELLANEOUS) ×3 IMPLANT
SURGILUBE 2OZ TUBE FLIPTOP (MISCELLANEOUS) ×3 IMPLANT
SUT CHROMIC 2 0 SH (SUTURE) IMPLANT
SUT CHROMIC 3 0 SH 27 (SUTURE) ×3 IMPLANT
SUT ETHILON 3-0 FS-10 30 BLK (SUTURE) ×3
SUT PROLENE 3 0 PS 2 (SUTURE) IMPLANT
SUTURE EHLN 3-0 FS-10 30 BLK (SUTURE) ×1 IMPLANT
SYRINGE 10CC LL (SYRINGE) ×3 IMPLANT

## 2017-11-12 NOTE — H&P (Signed)
Patient presented for hemorrhoidectomy. On drug screen, the patient was found positive for cocaine. This will put patient in risk of complication under anesthesia. The patient was oriented about the finding and the risk by Anesthesia team and case was cancelled. Will be reschedule. Patient oriented that if continue to be positive will need to be re scheduled again. Patient refers understood the conversation.

## 2017-11-19 ENCOUNTER — Inpatient Hospital Stay
Admission: RE | Admit: 2017-11-19 | Discharge: 2017-11-19 | Disposition: A | Discharge status: Home / Self Care | Payer: Self-pay | Source: Ambulatory Visit

## 2017-11-19 ENCOUNTER — Ambulatory Visit: Payer: Self-pay | Admitting: General Surgery

## 2017-11-19 NOTE — H&P (Signed)
PATIENT PROFILE: Andre Wagner is a 56 y.o. male who presents to the Clinic for consultation for evaluation of hemorrhoids.  PCP:  George, Sionne Anhelicia Cid, MD  HISTORY OF PRESENT ILLNESS: Andre Wagner comes to the clinic for evaluation of hemorrhoids. The patient refers that 3 days ago when he was sitting on the toilet, the patient felt a sharp pain on the perianal area and saw some bleeding . After that the pain was so intense that he decided to go the ER. Pain was on the anal area, does not radiates to other part of the body. Nothing help to improve the pain. The pain has decreased slowly being 8/10 when it started and now 6/10. Sitting down for a prolonged time and certain positions makes the pain worse. Denies fever or chills.    PROBLEM LIST:         Problem List  Date Reviewed: 09/24/2017         Noted   Primary osteoarthritis of right knee 08/12/2017   Chronic pain of right knee 08/07/2017   Alkaline phosphatase elevation 07/10/2017   Elevated LFTs 07/10/2017   Hyperlipidemia, mixed 07/10/2017   Ganglion cyst of wrist, right 07/10/2017   Need for vaccination 07/10/2017   Screen for colon cancer 07/10/2017   Varicose veins of right lower extremity 03/31/2017   Complex tear of medial meniscus of right knee, unspecified whether old or current tear, initial encounter 03/28/2017   Umbilical hernia without obstruction and without gangrene 10/08/2016   Essential hypertension 10/08/2016   Rectal bleeding 10/08/2016   History of peptic ulcer disease 02/16/2013   Tobacco abuse Unknown   GERD (gastroesophageal reflux disease) Unknown   Anxiety, unspecified Unknown   Depression, unspecified Unknown   History of Dependency on pain medication Unknown   Chronic pain 12/10/2012      GENERAL REVIEW OF SYSTEMS:   General ROS: negative for - chills, fatigue, fever, weight gain or weight loss Allergy and Immunology ROS: negative for - hives  Hematological and Lymphatic  ROS: negative for - bleeding problems or bruising, negative for palpable nodes Endocrine ROS: negative for - heat or cold intolerance, hair changes Respiratory ROS: negative for - cough, shortness of breath or wheezing Cardiovascular ROS: no chest pain or palpitations GI ROS: negative for nausea, vomiting, abdominal pain, diarrhea, constipation Musculoskeletal ROS: negative for - joint swelling or muscle pain Neurological ROS: negative for - confusion, syncope Dermatological ROS: negative for pruritus and rash  MEDICATIONS: CurrentMedications        Current Outpatient Medications  Medication Sig Dispense Refill  . albuterol 90 mcg/actuation inhaler Inhale 2 inhalations into the lungs every 6 (six) hours as needed for Wheezing. 1 Inhaler 0  . amLODIPine (NORVASC) 5 MG tablet TAKE ONE TABLET BY MOUTH EVERY DAY 30 tablet 8  . atorvastatin (LIPITOR) 20 MG tablet Take 1 tablet (20 mg total) by mouth once daily. 30 tablet 11  . meloxicam (MOBIC) 7.5 MG tablet Take 7.5 mg by mouth once daily  0  . pantoprazole (PROTONIX) 40 MG DR tablet Take 1 tablet (40 mg total) by mouth once daily. Take 15-20 mins before meal 30 tablet 11   No current facility-administered medications for this visit.       ALLERGIES: Codeine  PAST MEDICAL HISTORY:     Past Medical History:  Diagnosis Date  . Anxiety, unspecified   . Chronic pain 12/10/2012  . Dependency on pain medication (CMS-HCC)    Previous History of use -quit January, 2013  .   Depression, unspecified   . Essential hypertension 10/08/2016  . Ganglion cyst of wrist, right 07/10/2017  . GERD (gastroesophageal reflux disease)   . History of peptic ulcer disease 02/16/2013  . Hyperlipidemia, mixed 07/10/2017  . Rectal bleeding 10/08/2016  . Tobacco abuse   . Umbilical hernia without obstruction and without gangrene 10/08/2016  . Varicose veins of right lower extremity 03/31/2017    PAST SURGICAL HISTORY:      Past Surgical  History:  Procedure Laterality Date  .  mandibular surgery Left    With metal plate fixation  . Back surgery to remove benign mass     Upper back between shoulder blades  . COLONOSCOPY    . Diskectomy     L5-S1  . FUNCTIONAL ENDOSCOPIC SINUS SURGERY Left   . Hip bone harvest Right    To repair fracture of the mandible  . KNEE ARTHROSCOPY Right 08/15/2017   Trephination of partial medial meniscustear, debridement and microfracture of lateral femoral chondral lesion  . UMBILICAL HERNIA REPAIR  10/25/2016   Dr Wilton Smith  . UPPER GASTROINTESTINAL ENDOSCOPY       FAMILY HISTORY:      Family History  Problem Relation Age of Onset  . COPD Mother   . Bone cancer Father   . Coronary Artery Disease (Blocked arteries around heart) Maternal Grandmother   . Stroke Maternal Grandmother   . Colon polyps Maternal Grandfather 62       Twice with colon cancer  . Coronary Artery Disease (Blocked arteries around heart) Maternal Grandfather   . Colon cancer Maternal Grandfather      SOCIAL HISTORY: Social History        Socioeconomic History  . Marital status: Single    Spouse name: None  . Number of children: 0  . Years of education: None  . Highest education level: None  Social Needs  . Financial resource strain: None  . Food insecurity - worry: None  . Food insecurity - inability: None  . Transportation needs - medical: None  . Transportation needs - non-medical: None  Occupational History  . Occupation: Production line worker  Tobacco Use  . Smoking status: Current Every Day Smoker    Packs/day: 0.50    Years: 30.00    Pack years: 15.00    Types: Cigarettes  . Smokeless tobacco: Never Used  Substance and Sexual Activity  . Alcohol use: Yes    Alcohol/week: 3.6 oz    Types: 6 Cans of beer per week    Comment: per week  . Drug use: No  . Sexual activity: Not Currently    Partners: Female    Birth control/protection:  Condom  Other Topics Concern  . None  Social History Narrative   Lives alone, he smokes cigarettes, he is not physically active, reports between 3 to 6 alcoholic drinks a week, denies the use of illegal or recreational drugs.      PHYSICAL EXAM:    Vitals:   11/04/17 1048  BP: 132/88  Pulse: (!) 113  Temp: 37.4 C (99.3 F)   Body mass index is 29.05 kg/m. Weight: 81.6 kg (180 lb)   General Appearance:    Alert, cooperative, no distress, appears stated age  Head:     Atraumatic, normocephalic  Eyes:   Anciteric, no erythema, no secretions  Neck:   Supple, symmetrical, no JVD, no palpable lymph nodes  Mouth:   Lips, mucosa, and tongue normal;   Lungs:     Clear   to auscultation bilaterally, respirations unlabored   Heart:    Regular rate and rhythm, S1 and S2 normal, no murmur, rub   or gallop  Abdomen:  Rectal:      Soft, non-tender, bowel sounds active all four quadrants,    no masses, no organomegaly   Thrombosed external hemorrhoid, no bleeding, unable to do finger rectal exam of internal hemorrhoid due to pain.   Extremities:   Extremities normal, atraumatic, no cyanosis or edema  Skin:   Skin color, texture, turgor normal, no rashes or lesions   Neurologic:   Grossly intact.   REVIEW OF DATA: I have reviewed the following data today:      No visits with results within 3 Month(s) from this visit.  Latest known visit with results is:  Office Visit on 07/25/2017  Component Date Value  . Hepatitis B Surface Anti* 07/25/2017 Reactive*  . Hepatitis B Surface Anti* 07/25/2017 >1,000      ASSESSMENT: Andre Wagner is a 56 y.o. male who was previously scheduled for hemorrhoidectomy but elective surgery was cancelled because the patient was positive for cocaine the same day of procedure.  Patient with internal hemorrhoids with bleeding and external thrombosed hemorrhoid, subacute at the moment of evaluation. Pain has improved. Patient will need rectal exam under  anesthesia with hemorrhoidectomy. Patient oriented about the diagnosis of hemorrhoid, its implication and its treatment alternatives. Patient wants surgical management due to the pain. Hemorrhoidectomy procedure was described to patient and the risk of gas and/or fecal incontinence, urinary retention, pain, and stricture were explained to patient.  Patient oriented about using an enema the night before the procedure. Patient oriented that if on pre operative labs comes positive again will need to re schedule surgery again.    PLAN: 1. Anorectal exam under anesthesia CPT: 45990 2. Internal and External Hemorrhoidectomy Complex CPT: 46260 3. Do not take aspirin or its derivatives 5-7 days before and after surgery 4. CBC, CMP, EKG - done 5. Follow up pre admission instruction   Patient verbalized understanding, all questions were answered, and were agreeable with the plan outlined above.   Abdou Stocks Cintron-Diaz, MD  Electronically signed by Ingvald Theisen Cintron-Diaz, MD         

## 2017-11-19 NOTE — H&P (View-Only) (Signed)
PATIENT PROFILE: Andre Wagner is a 56 y.o. male who presents to the Clinic for consultation for evaluation of hemorrhoids.  PCP:  Andre Wagner, Andre Anhelicia Cid, MD  HISTORY OF PRESENT ILLNESS: Mr. Andre Wagner comes to the clinic for evaluation of hemorrhoids. The patient refers that 3 days ago when he was sitting on the toilet, the patient felt a sharp pain on the perianal area and saw some bleeding . After that the pain was so intense that he decided to go the ER. Pain was on the anal area, does not radiates to other part of the body. Nothing help to improve the pain. The pain has decreased slowly being 8/10 when it started and now 6/10. Sitting down for a prolonged time and certain positions makes the pain worse. Denies fever or chills.    PROBLEM LIST:         Problem List  Date Reviewed: 09/24/2017         Noted   Primary osteoarthritis of right knee 08/12/2017   Chronic pain of right knee 08/07/2017   Alkaline phosphatase elevation 07/10/2017   Elevated LFTs 07/10/2017   Hyperlipidemia, mixed 07/10/2017   Ganglion cyst of wrist, right 07/10/2017   Need for vaccination 07/10/2017   Screen for colon cancer 07/10/2017   Varicose veins of right lower extremity 03/31/2017   Complex tear of medial meniscus of right knee, unspecified whether old or current tear, initial encounter 03/28/2017   Umbilical hernia without obstruction and without gangrene 10/08/2016   Essential hypertension 10/08/2016   Rectal bleeding 10/08/2016   History of peptic ulcer disease 02/16/2013   Tobacco abuse Unknown   GERD (gastroesophageal reflux disease) Unknown   Anxiety, unspecified Unknown   Depression, unspecified Unknown   History of Dependency on pain medication Unknown   Chronic pain 12/10/2012      GENERAL REVIEW OF SYSTEMS:   General ROS: negative for - chills, fatigue, fever, weight gain or weight loss Allergy and Immunology ROS: negative for - hives  Hematological and Lymphatic  ROS: negative for - bleeding problems or bruising, negative for palpable nodes Endocrine ROS: negative for - heat or cold intolerance, hair changes Respiratory ROS: negative for - cough, shortness of breath or wheezing Cardiovascular ROS: no chest pain or palpitations GI ROS: negative for nausea, vomiting, abdominal pain, diarrhea, constipation Musculoskeletal ROS: negative for - joint swelling or muscle pain Neurological ROS: negative for - confusion, syncope Dermatological ROS: negative for pruritus and rash  MEDICATIONS: CurrentMedications        Current Outpatient Medications  Medication Sig Dispense Refill  . albuterol 90 mcg/actuation inhaler Inhale 2 inhalations into the lungs every 6 (six) hours as needed for Wheezing. 1 Inhaler 0  . amLODIPine (NORVASC) 5 MG tablet TAKE ONE TABLET BY MOUTH EVERY DAY 30 tablet 8  . atorvastatin (LIPITOR) 20 MG tablet Take 1 tablet (20 mg total) by mouth once daily. 30 tablet 11  . meloxicam (MOBIC) 7.5 MG tablet Take 7.5 mg by mouth once daily  0  . pantoprazole (PROTONIX) 40 MG DR tablet Take 1 tablet (40 mg total) by mouth once daily. Take 15-20 mins before meal 30 tablet 11   No current facility-administered medications for this visit.       ALLERGIES: Codeine  PAST MEDICAL HISTORY:     Past Medical History:  Diagnosis Date  . Anxiety, unspecified   . Chronic pain 12/10/2012  . Dependency on pain medication (CMS-HCC)    Previous History of use -quit January, 2013  .  Depression, unspecified   . Essential hypertension 10/08/2016  . Ganglion cyst of wrist, right 07/10/2017  . GERD (gastroesophageal reflux disease)   . History of peptic ulcer disease 02/16/2013  . Hyperlipidemia, mixed 07/10/2017  . Rectal bleeding 10/08/2016  . Tobacco abuse   . Umbilical hernia without obstruction and without gangrene 10/08/2016  . Varicose veins of right lower extremity 03/31/2017    PAST SURGICAL HISTORY:      Past Surgical  History:  Procedure Laterality Date  .  mandibular surgery Left    With metal plate fixation  . Back surgery to remove benign mass     Upper back between shoulder blades  . COLONOSCOPY    . Diskectomy     L5-S1  . FUNCTIONAL ENDOSCOPIC SINUS SURGERY Left   . Hip bone harvest Right    To repair fracture of the mandible  . KNEE ARTHROSCOPY Right 08/15/2017   Trephination of partial medial meniscustear, debridement and microfracture of lateral femoral chondral lesion  . UMBILICAL HERNIA REPAIR  10/25/2016   Dr Andre Wagner  . UPPER GASTROINTESTINAL ENDOSCOPY       FAMILY HISTORY:      Family History  Problem Relation Age of Onset  . COPD Mother   . Bone cancer Father   . Coronary Artery Disease (Blocked arteries around heart) Maternal Grandmother   . Stroke Maternal Grandmother   . Colon polyps Maternal Grandfather 62       Twice with colon cancer  . Coronary Artery Disease (Blocked arteries around heart) Maternal Grandfather   . Colon cancer Maternal Grandfather      SOCIAL HISTORY: Social History        Socioeconomic History  . Marital status: Single    Spouse name: None  . Number of children: 0  . Years of education: None  . Highest education level: None  Social Needs  . Financial resource strain: None  . Food insecurity - worry: None  . Food insecurity - inability: None  . Transportation needs - medical: None  . Transportation needs - non-medical: None  Occupational History  . Occupation: Forensic psychologistroduction line worker  Tobacco Use  . Smoking status: Current Every Day Smoker    Packs/day: 0.50    Years: 30.00    Pack years: 15.00    Types: Cigarettes  . Smokeless tobacco: Never Used  Substance and Sexual Activity  . Alcohol use: Yes    Alcohol/week: 3.6 oz    Types: 6 Cans of beer per week    Comment: per week  . Drug use: No  . Sexual activity: Not Currently    Partners: Female    Birth control/protection:  Condom  Other Topics Concern  . None  Social History Narrative   Lives alone, he smokes cigarettes, he is not physically active, reports between 3 to 6 alcoholic drinks a week, denies the use of illegal or recreational drugs.      PHYSICAL EXAM:    Vitals:   11/04/17 1048  BP: 132/88  Pulse: (!) 113  Temp: 37.4 C (99.3 F)   Body mass index is 29.05 kg/m. Weight: 81.6 kg (180 lb)   General Appearance:    Alert, cooperative, no distress, appears stated age  Head:     Atraumatic, normocephalic  Eyes:   Anciteric, no erythema, no secretions  Neck:   Supple, symmetrical, no JVD, no palpable lymph nodes  Mouth:   Lips, mucosa, and tongue normal;   Lungs:     Clear  to auscultation bilaterally, respirations unlabored   Heart:    Regular rate and rhythm, S1 and S2 normal, no murmur, rub   or gallop  Abdomen:  Rectal:      Soft, non-tender, bowel sounds active all four quadrants,    no masses, no organomegaly   Thrombosed external hemorrhoid, no bleeding, unable to do finger rectal exam of internal hemorrhoid due to pain.   Extremities:   Extremities normal, atraumatic, no cyanosis or edema  Skin:   Skin color, texture, turgor normal, no rashes or lesions   Neurologic:   Grossly intact.   REVIEW OF DATA: I have reviewed the following data today:      No visits with results within 3 Month(s) from this visit.  Latest known visit with results is:  Office Visit on 07/25/2017  Component Date Value  . Hepatitis B Surface Anti* 07/25/2017 Reactive*  . Hepatitis B Surface Anti* 07/25/2017 >1,000      ASSESSMENT: Mr. Ballengee is a 56 y.o. male who was previously scheduled for hemorrhoidectomy but elective surgery was cancelled because the patient was positive for cocaine the same day of procedure.  Patient with internal hemorrhoids with bleeding and external thrombosed hemorrhoid, subacute at the moment of evaluation. Pain has improved. Patient will need rectal exam under  anesthesia with hemorrhoidectomy. Patient oriented about the diagnosis of hemorrhoid, its implication and its treatment alternatives. Patient wants surgical management due to the pain. Hemorrhoidectomy procedure was described to patient and the risk of gas and/or fecal incontinence, urinary retention, pain, and stricture were explained to patient.  Patient oriented about using an enema the night before the procedure. Patient oriented that if on pre operative labs comes positive again will need to re schedule surgery again.    PLAN: 1. Anorectal exam under anesthesia CPT: 45990 2. Internal and External Hemorrhoidectomy Complex CPT: 46260 3. Do not take aspirin or its derivatives 5-7 days before and after surgery 4. CBC, CMP, EKG - done 5. Follow up pre admission instruction   Patient verbalized understanding, all questions were answered, and were agreeable with the plan outlined above.   Carolan Shiver, MD  Electronically signed by Carolan Shiver, MD

## 2017-11-25 MED ORDER — METRONIDAZOLE IN NACL 5-0.79 MG/ML-% IV SOLN
500.0000 mg | INTRAVENOUS | Status: AC
  Administered 2017-11-26: 500 mg via INTRAVENOUS
  Filled 2017-11-25: qty 100

## 2017-11-25 MED ORDER — CIPROFLOXACIN IN D5W 400 MG/200ML IV SOLN
400.0000 mg | INTRAVENOUS | Status: AC
  Administered 2017-11-26: 400 mg via INTRAVENOUS

## 2017-11-26 ENCOUNTER — Other Ambulatory Visit: Payer: Self-pay

## 2017-11-26 ENCOUNTER — Ambulatory Visit: Payer: BLUE CROSS/BLUE SHIELD | Admitting: Anesthesiology

## 2017-11-26 ENCOUNTER — Observation Stay
Admission: RE | Admit: 2017-11-26 | Discharge: 2017-11-27 | Disposition: A | Discharge status: Home / Self Care | Payer: BLUE CROSS/BLUE SHIELD | Source: Ambulatory Visit | Attending: General Surgery | Admitting: General Surgery

## 2017-11-26 ENCOUNTER — Encounter
Admission: RE | Disposition: A | Discharge status: Home / Self Care | Payer: Self-pay | Source: Ambulatory Visit | Attending: General Surgery

## 2017-11-26 ENCOUNTER — Encounter: Payer: Self-pay | Admitting: *Deleted

## 2017-11-26 DIAGNOSIS — K645 Perianal venous thrombosis: Secondary | ICD-10-CM | POA: Diagnosis not present

## 2017-11-26 DIAGNOSIS — Z8711 Personal history of peptic ulcer disease: Secondary | ICD-10-CM | POA: Insufficient documentation

## 2017-11-26 DIAGNOSIS — M1711 Unilateral primary osteoarthritis, right knee: Secondary | ICD-10-CM | POA: Diagnosis not present

## 2017-11-26 DIAGNOSIS — K219 Gastro-esophageal reflux disease without esophagitis: Secondary | ICD-10-CM | POA: Insufficient documentation

## 2017-11-26 DIAGNOSIS — M25561 Pain in right knee: Secondary | ICD-10-CM | POA: Diagnosis not present

## 2017-11-26 DIAGNOSIS — M67431 Ganglion, right wrist: Secondary | ICD-10-CM | POA: Diagnosis not present

## 2017-11-26 DIAGNOSIS — Z823 Family history of stroke: Secondary | ICD-10-CM | POA: Insufficient documentation

## 2017-11-26 DIAGNOSIS — K641 Second degree hemorrhoids: Secondary | ICD-10-CM | POA: Diagnosis not present

## 2017-11-26 DIAGNOSIS — F329 Major depressive disorder, single episode, unspecified: Secondary | ICD-10-CM | POA: Diagnosis not present

## 2017-11-26 DIAGNOSIS — F419 Anxiety disorder, unspecified: Secondary | ICD-10-CM | POA: Diagnosis not present

## 2017-11-26 DIAGNOSIS — Z808 Family history of malignant neoplasm of other organs or systems: Secondary | ICD-10-CM | POA: Insufficient documentation

## 2017-11-26 DIAGNOSIS — I10 Essential (primary) hypertension: Secondary | ICD-10-CM | POA: Diagnosis not present

## 2017-11-26 DIAGNOSIS — E782 Mixed hyperlipidemia: Secondary | ICD-10-CM | POA: Insufficient documentation

## 2017-11-26 DIAGNOSIS — I8390 Asymptomatic varicose veins of unspecified lower extremity: Secondary | ICD-10-CM | POA: Diagnosis not present

## 2017-11-26 DIAGNOSIS — Z8249 Family history of ischemic heart disease and other diseases of the circulatory system: Secondary | ICD-10-CM | POA: Insufficient documentation

## 2017-11-26 DIAGNOSIS — F17210 Nicotine dependence, cigarettes, uncomplicated: Secondary | ICD-10-CM | POA: Diagnosis not present

## 2017-11-26 DIAGNOSIS — Z800 Family history of malignant neoplasm of digestive organs: Secondary | ICD-10-CM | POA: Insufficient documentation

## 2017-11-26 DIAGNOSIS — Z885 Allergy status to narcotic agent status: Secondary | ICD-10-CM | POA: Insufficient documentation

## 2017-11-26 DIAGNOSIS — Z79899 Other long term (current) drug therapy: Secondary | ICD-10-CM | POA: Diagnosis not present

## 2017-11-26 DIAGNOSIS — K429 Umbilical hernia without obstruction or gangrene: Secondary | ICD-10-CM | POA: Diagnosis not present

## 2017-11-26 DIAGNOSIS — Z825 Family history of asthma and other chronic lower respiratory diseases: Secondary | ICD-10-CM | POA: Insufficient documentation

## 2017-11-26 DIAGNOSIS — F410 Panic disorder [episodic paroxysmal anxiety] without agoraphobia: Secondary | ICD-10-CM | POA: Diagnosis not present

## 2017-11-26 DIAGNOSIS — Z8371 Family history of colonic polyps: Secondary | ICD-10-CM | POA: Insufficient documentation

## 2017-11-26 DIAGNOSIS — G8929 Other chronic pain: Secondary | ICD-10-CM | POA: Insufficient documentation

## 2017-11-26 DIAGNOSIS — G8918 Other acute postprocedural pain: Secondary | ICD-10-CM | POA: Diagnosis present

## 2017-11-26 HISTORY — PX: EVALUATION UNDER ANESTHESIA WITH HEMORRHOIDECTOMY: SHX5624

## 2017-11-26 LAB — URINE DRUG SCREEN, QUALITATIVE (ARMC ONLY)
AMPHETAMINES, UR SCREEN: NOT DETECTED
BENZODIAZEPINE, UR SCRN: NOT DETECTED
Barbiturates, Ur Screen: NOT DETECTED
COCAINE METABOLITE, UR ~~LOC~~: NOT DETECTED
Cannabinoid 50 Ng, Ur ~~LOC~~: NOT DETECTED
MDMA (Ecstasy)Ur Screen: NOT DETECTED
METHADONE SCREEN, URINE: NOT DETECTED
OPIATE, UR SCREEN: NOT DETECTED
PHENCYCLIDINE (PCP) UR S: NOT DETECTED
Tricyclic, Ur Screen: NOT DETECTED

## 2017-11-26 LAB — CREATININE, SERUM: Creatinine, Ser: 0.92 mg/dL (ref 0.61–1.24)

## 2017-11-26 LAB — CBC
HEMATOCRIT: 40.3 % (ref 40.0–52.0)
HEMOGLOBIN: 13.1 g/dL (ref 13.0–18.0)
MCH: 28.4 pg (ref 26.0–34.0)
MCHC: 32.5 g/dL (ref 32.0–36.0)
MCV: 87.5 fL (ref 80.0–100.0)
Platelets: 192 10*3/uL (ref 150–440)
RBC: 4.61 MIL/uL (ref 4.40–5.90)
RDW: 15.4 % — ABNORMAL HIGH (ref 11.5–14.5)
WBC: 12.1 10*3/uL — AB (ref 3.8–10.6)

## 2017-11-26 SURGERY — EXAM UNDER ANESTHESIA WITH HEMORRHOIDECTOMY
Anesthesia: General

## 2017-11-26 MED ORDER — PROMETHAZINE HCL 25 MG/ML IJ SOLN
6.2500 mg | INTRAMUSCULAR | Status: DC | PRN
  Administered 2017-11-26: 12.5 mg via INTRAVENOUS

## 2017-11-26 MED ORDER — FENTANYL CITRATE (PF) 100 MCG/2ML IJ SOLN
INTRAMUSCULAR | Status: AC
  Administered 2017-11-26: 50 ug via INTRAVENOUS
  Filled 2017-11-26: qty 2

## 2017-11-26 MED ORDER — TRAMADOL HCL 50 MG PO TABS: 50.0000 mg | ORAL_TABLET | Freq: Four times a day (QID) | ORAL | 0 refills | Status: DC | PRN

## 2017-11-26 MED ORDER — LIDOCAINE HCL (PF) 2 % IJ SOLN
INTRAMUSCULAR | Status: AC
  Filled 2017-11-26: qty 10

## 2017-11-26 MED ORDER — BUPIVACAINE LIPOSOME 1.3 % IJ SUSP
INTRAMUSCULAR | Status: AC
  Filled 2017-11-26: qty 20

## 2017-11-26 MED ORDER — LACTATED RINGERS IV SOLN
INTRAVENOUS | Status: DC
  Administered 2017-11-26: 09:00:00 via INTRAVENOUS

## 2017-11-26 MED ORDER — FENTANYL CITRATE (PF) 100 MCG/2ML IJ SOLN
INTRAMUSCULAR | Status: AC
  Administered 2017-11-26: 25 ug via INTRAVENOUS
  Filled 2017-11-26: qty 2

## 2017-11-26 MED ORDER — MEPERIDINE HCL 50 MG/ML IJ SOLN: 6.2500 mg | INTRAMUSCULAR | Status: DC | PRN

## 2017-11-26 MED ORDER — DEXAMETHASONE SODIUM PHOSPHATE 10 MG/ML IJ SOLN
INTRAMUSCULAR | Status: DC | PRN
  Administered 2017-11-26: 10 mg via INTRAVENOUS

## 2017-11-26 MED ORDER — PROPOFOL 10 MG/ML IV BOLUS
INTRAVENOUS | Status: DC | PRN
  Administered 2017-11-26: 200 mg via INTRAVENOUS

## 2017-11-26 MED ORDER — PANTOPRAZOLE SODIUM 40 MG IV SOLR
40.0000 mg | Freq: Every day | INTRAVENOUS | Status: DC
  Administered 2017-11-26: 40 mg via INTRAVENOUS
  Filled 2017-11-26: qty 40

## 2017-11-26 MED ORDER — MIDAZOLAM HCL 2 MG/2ML IJ SOLN
INTRAMUSCULAR | Status: AC
  Filled 2017-11-26: qty 2

## 2017-11-26 MED ORDER — ONDANSETRON HCL 4 MG/2ML IJ SOLN
INTRAMUSCULAR | Status: DC | PRN
  Administered 2017-11-26: 4 mg via INTRAVENOUS

## 2017-11-26 MED ORDER — POLYETHYLENE GLYCOL 3350 17 GM/SCOOP PO POWD: 17.0000 g | Freq: Every day | ORAL | 0 refills | Status: AC

## 2017-11-26 MED ORDER — OXYCODONE HCL 5 MG/5ML PO SOLN: 5.0000 mg | Freq: Once | ORAL | Status: AC | PRN

## 2017-11-26 MED ORDER — HYDROMORPHONE HCL 1 MG/ML IJ SOLN
0.5000 mg | INTRAMUSCULAR | Status: AC | PRN
  Administered 2017-11-26 – 2017-11-27 (×6): 0.5 mg via INTRAVENOUS
  Filled 2017-11-26 (×6): qty 0.5

## 2017-11-26 MED ORDER — DEXAMETHASONE SODIUM PHOSPHATE 10 MG/ML IJ SOLN
INTRAMUSCULAR | Status: AC
  Filled 2017-11-26: qty 1

## 2017-11-26 MED ORDER — POLYETHYLENE GLYCOL 3350 17 G PO PACK
17.0000 g | PACK | Freq: Every day | ORAL | Status: DC | PRN
  Administered 2017-11-27: 17 g via ORAL
  Filled 2017-11-26 (×2): qty 1

## 2017-11-26 MED ORDER — ENOXAPARIN SODIUM 40 MG/0.4ML ~~LOC~~ SOLN
40.0000 mg | SUBCUTANEOUS | Status: DC
  Administered 2017-11-27: 40 mg via SUBCUTANEOUS
  Filled 2017-11-26: qty 0.4

## 2017-11-26 MED ORDER — SUGAMMADEX SODIUM 200 MG/2ML IV SOLN
INTRAVENOUS | Status: AC
  Filled 2017-11-26: qty 2

## 2017-11-26 MED ORDER — DIPHENHYDRAMINE HCL 50 MG/ML IJ SOLN: 25.0000 mg | Freq: Four times a day (QID) | INTRAMUSCULAR | Status: DC | PRN

## 2017-11-26 MED ORDER — DIPHENHYDRAMINE HCL 25 MG PO CAPS: 25.0000 mg | ORAL_CAPSULE | Freq: Four times a day (QID) | ORAL | Status: DC | PRN

## 2017-11-26 MED ORDER — LABETALOL HCL 5 MG/ML IV SOLN
INTRAVENOUS | Status: DC | PRN
  Administered 2017-11-26: 5 mg via INTRAVENOUS

## 2017-11-26 MED ORDER — PROPOFOL 10 MG/ML IV BOLUS
INTRAVENOUS | Status: AC
  Filled 2017-11-26: qty 20

## 2017-11-26 MED ORDER — ONDANSETRON HCL 4 MG/2ML IJ SOLN
4.0000 mg | Freq: Four times a day (QID) | INTRAMUSCULAR | Status: DC | PRN
  Administered 2017-11-27: 4 mg via INTRAVENOUS

## 2017-11-26 MED ORDER — OXYCODONE HCL 5 MG PO TABS
5.0000 mg | ORAL_TABLET | Freq: Once | ORAL | Status: AC
  Administered 2017-11-26: 5 mg via ORAL

## 2017-11-26 MED ORDER — LORAZEPAM 2 MG/ML IJ SOLN
INTRAMUSCULAR | Status: AC
  Filled 2017-11-26: qty 1

## 2017-11-26 MED ORDER — KETOROLAC TROMETHAMINE 30 MG/ML IJ SOLN
30.0000 mg | Freq: Four times a day (QID) | INTRAMUSCULAR | Status: DC
  Administered 2017-11-26 – 2017-11-27 (×5): 30 mg via INTRAVENOUS
  Filled 2017-11-26 (×5): qty 1

## 2017-11-26 MED ORDER — HYDROMORPHONE HCL 1 MG/ML IJ SOLN
0.5000 mg | INTRAMUSCULAR | Status: DC | PRN
  Administered 2017-11-26 (×2): 0.5 mg via INTRAVENOUS

## 2017-11-26 MED ORDER — SUGAMMADEX SODIUM 500 MG/5ML IV SOLN
INTRAVENOUS | Status: AC
  Filled 2017-11-26: qty 5

## 2017-11-26 MED ORDER — ONDANSETRON HCL 4 MG/2ML IJ SOLN
INTRAMUSCULAR | Status: AC
  Filled 2017-11-26: qty 2

## 2017-11-26 MED ORDER — KETOROLAC TROMETHAMINE 30 MG/ML IJ SOLN
INTRAMUSCULAR | Status: DC | PRN
  Administered 2017-11-26: 30 mg via INTRAVENOUS

## 2017-11-26 MED ORDER — SUGAMMADEX SODIUM 200 MG/2ML IV SOLN
INTRAVENOUS | Status: DC | PRN
  Administered 2017-11-26: 200 mg via INTRAVENOUS

## 2017-11-26 MED ORDER — BUPIVACAINE HCL (PF) 0.5 % IJ SOLN
INTRAMUSCULAR | Status: AC
  Filled 2017-11-26: qty 30

## 2017-11-26 MED ORDER — FAMOTIDINE 20 MG PO TABS
20.0000 mg | ORAL_TABLET | Freq: Once | ORAL | Status: AC
  Administered 2017-11-26: 20 mg via ORAL

## 2017-11-26 MED ORDER — ROCURONIUM BROMIDE 100 MG/10ML IV SOLN
INTRAVENOUS | Status: DC | PRN
  Administered 2017-11-26: 50 mg via INTRAVENOUS

## 2017-11-26 MED ORDER — FLEET ENEMA 7-19 GM/118ML RE ENEM: 1.0000 | ENEMA | Freq: Once | RECTAL | Status: DC

## 2017-11-26 MED ORDER — FENTANYL CITRATE (PF) 100 MCG/2ML IJ SOLN
25.0000 ug | INTRAMUSCULAR | Status: DC | PRN
  Administered 2017-11-26 (×6): 25 ug via INTRAVENOUS

## 2017-11-26 MED ORDER — SODIUM CHLORIDE 0.9 % IJ SOLN
INTRAMUSCULAR | Status: AC
  Filled 2017-11-26: qty 50

## 2017-11-26 MED ORDER — LIDOCAINE HCL (CARDIAC) 20 MG/ML IV SOLN
INTRAVENOUS | Status: DC | PRN
  Administered 2017-11-26: 100 mg via INTRAVENOUS

## 2017-11-26 MED ORDER — LORAZEPAM 2 MG/ML IJ SOLN
1.0000 mg | Freq: Four times a day (QID) | INTRAMUSCULAR | Status: AC | PRN
  Filled 2017-11-26: qty 0.5

## 2017-11-26 MED ORDER — OXYCODONE HCL 5 MG PO TABS
ORAL_TABLET | ORAL | Status: AC
  Administered 2017-11-26: 5 mg via ORAL
  Filled 2017-11-26: qty 1

## 2017-11-26 MED ORDER — KETOROLAC TROMETHAMINE 30 MG/ML IJ SOLN
INTRAMUSCULAR | Status: AC
  Filled 2017-11-26: qty 1

## 2017-11-26 MED ORDER — AMLODIPINE BESYLATE 5 MG PO TABS
5.0000 mg | ORAL_TABLET | Freq: Every day | ORAL | Status: DC
  Administered 2017-11-26 – 2017-11-27 (×2): 5 mg via ORAL
  Filled 2017-11-26 (×2): qty 1

## 2017-11-26 MED ORDER — LABETALOL HCL 5 MG/ML IV SOLN
INTRAVENOUS | Status: AC
  Filled 2017-11-26: qty 4

## 2017-11-26 MED ORDER — ROCURONIUM BROMIDE 50 MG/5ML IV SOLN
INTRAVENOUS | Status: AC
  Filled 2017-11-26: qty 1

## 2017-11-26 MED ORDER — LORAZEPAM 2 MG/ML IJ SOLN
1.0000 mg | Freq: Four times a day (QID) | INTRAMUSCULAR | Status: DC | PRN
Start: 2017-11-26 — End: 2017-11-26
  Filled 2017-11-26: qty 0.5

## 2017-11-26 MED ORDER — HYDROMORPHONE HCL 1 MG/ML IJ SOLN
INTRAMUSCULAR | Status: AC
  Administered 2017-11-26: 0.5 mg via INTRAVENOUS
  Filled 2017-11-26: qty 1

## 2017-11-26 MED ORDER — OXYCODONE HCL 5 MG PO TABS
5.0000 mg | ORAL_TABLET | Freq: Once | ORAL | Status: AC | PRN
  Administered 2017-11-26: 5 mg via ORAL

## 2017-11-26 MED ORDER — BUPIVACAINE LIPOSOME 1.3 % IJ SUSP
INTRAMUSCULAR | Status: DC | PRN
  Administered 2017-11-26: 20 mL

## 2017-11-26 MED ORDER — FENTANYL CITRATE (PF) 100 MCG/2ML IJ SOLN
INTRAMUSCULAR | Status: DC | PRN
  Administered 2017-11-26 (×2): 100 ug via INTRAVENOUS

## 2017-11-26 MED ORDER — FENTANYL CITRATE (PF) 100 MCG/2ML IJ SOLN
INTRAMUSCULAR | Status: AC
  Filled 2017-11-26: qty 2

## 2017-11-26 MED ORDER — MIDAZOLAM HCL 2 MG/2ML IJ SOLN
INTRAMUSCULAR | Status: DC | PRN
  Administered 2017-11-26: 2 mg via INTRAVENOUS

## 2017-11-26 MED ORDER — FENTANYL CITRATE (PF) 100 MCG/2ML IJ SOLN
25.0000 ug | INTRAMUSCULAR | Status: DC | PRN
  Administered 2017-11-26 (×3): 50 ug via INTRAVENOUS

## 2017-11-26 MED ORDER — PSYLLIUM 28 % PO PACK: 1.0000 | PACK | Freq: Two times a day (BID) | ORAL | 11 refills | Status: DC

## 2017-11-26 MED ORDER — ACETAMINOPHEN 500 MG PO TABS
1000.0000 mg | ORAL_TABLET | Freq: Four times a day (QID) | ORAL | Status: DC
  Administered 2017-11-26 – 2017-11-27 (×5): 1000 mg via ORAL
  Filled 2017-11-26 (×5): qty 2

## 2017-11-26 MED ORDER — PROMETHAZINE HCL 25 MG/ML IJ SOLN
INTRAMUSCULAR | Status: AC
  Filled 2017-11-26: qty 1

## 2017-11-26 SURGICAL SUPPLY — 42 items
BLADE SURG 15 STRL LF DISP TIS (BLADE) ×1 IMPLANT
BLADE SURG 15 STRL SS (BLADE) ×3
CANISTER SUCT 1200ML W/VALVE (MISCELLANEOUS) ×3 IMPLANT
DRAPE LAPAROTOMY 100X77 ABD (DRAPES) ×3 IMPLANT
DRAPE LEGGINS SURG 28X43 STRL (DRAPES) ×1 IMPLANT
DRAPE UNDER BUTTOCK W/FLU (DRAPES) ×3 IMPLANT
ELECT REM PT RETURN 9FT ADLT (ELECTROSURGICAL) ×3
ELECTRODE REM PT RTRN 9FT ADLT (ELECTROSURGICAL) ×1 IMPLANT
GAUZE SPONGE 4X4 12PLY STRL (GAUZE/BANDAGES/DRESSINGS) ×3 IMPLANT
GLOVE BIO SURGEON STRL SZ 6.5 (GLOVE) ×3 IMPLANT
GLOVE BIO SURGEON STRL SZ7.5 (GLOVE) ×1 IMPLANT
GLOVE BIO SURGEONS STRL SZ 6.5 (GLOVE) ×3
GLOVE BIOGEL PI IND STRL 7.0 (GLOVE) IMPLANT
GLOVE BIOGEL PI INDICATOR 7.0 (GLOVE) ×2
GLOVE INDICATOR 6.5 STRL GRN (GLOVE) ×2 IMPLANT
GOWN STRL REUS W/ TWL LRG LVL3 (GOWN DISPOSABLE) ×2 IMPLANT
GOWN STRL REUS W/TWL LRG LVL3 (GOWN DISPOSABLE) ×9
HEMOSTAT SURGICEL 2X14 (HEMOSTASIS) ×2 IMPLANT
LABEL OR SOLS (LABEL) ×1 IMPLANT
NDL HYPO 25X1 1.5 SAFETY (NEEDLE) ×1 IMPLANT
NEEDLE HYPO 22GX1.5 SAFETY (NEEDLE) ×2 IMPLANT
NEEDLE HYPO 25X1 1.5 SAFETY (NEEDLE) IMPLANT
NS IRRIG 500ML POUR BTL (IV SOLUTION) ×3 IMPLANT
PACK BASIN MINOR ARMC (MISCELLANEOUS) ×3 IMPLANT
PAD ABD DERMACEA PRESS 5X9 (GAUZE/BANDAGES/DRESSINGS) ×1 IMPLANT
PAD PREP 24X41 OB/GYN DISP (PERSONAL CARE ITEMS) ×1 IMPLANT
SHEARS HARMONIC 9CM CVD (BLADE) ×3 IMPLANT
SOL PREP PVP 2OZ (MISCELLANEOUS) ×3
SOLUTION PREP PVP 2OZ (MISCELLANEOUS) ×1 IMPLANT
STAPLER PROXIMATE HCS (STAPLE) IMPLANT
STRAP SAFETY BODY (MISCELLANEOUS) ×1 IMPLANT
SURGILUBE 2OZ TUBE FLIPTOP (MISCELLANEOUS) ×3 IMPLANT
SUT CHROMIC 2 0 SH (SUTURE) IMPLANT
SUT CHROMIC 3 0 SH 27 (SUTURE) ×1 IMPLANT
SUT ETHILON 3-0 FS-10 30 BLK (SUTURE)
SUT PROLENE 3 0 PS 2 (SUTURE) IMPLANT
SUT VIC AB 2-0 SH 27 (SUTURE) ×12
SUT VIC AB 2-0 SH 27XBRD (SUTURE) IMPLANT
SUTURE EHLN 3-0 FS-10 30 BLK (SUTURE) ×1 IMPLANT
SYR 10ML LL (SYRINGE) ×1 IMPLANT
SYR 30ML LL (SYRINGE) ×2 IMPLANT
SYR BULB IRRIG 60ML STRL (SYRINGE) ×2 IMPLANT

## 2017-11-26 NOTE — Op Note (Signed)
Preoperative diagnosis: Second degree internal hemorrhoids with bleeding and external hemorrhoid thrombosed.   Postoperative diagnosis: Second degree internal hemorrhoids with bleeding and external hemorrhoid thrombosed.   Procedure: Anoscopy, hemorrhoidectomy.  Surgeon: Dr. Hazle Quantintron-Diaz  Anesthesia: General   Wound classification: Clean Contaminated  Indications: Patient is a [age] y.o. male was found to have symptomatic second degree internal hemorrhoids with bleeding and external hemorrhoid thrombosed that were refractory to medical managemen.   Findings: 1. Second degree internal hemorrhoids with bleeding and external hemorrhoid thrombosed.  2. Internal and external anal sphincter identified and preserved 3. Adequate hemostasis  Description of procedure: The patient was brought to the operating room and general anesthesia was induced. Patient was placed in the prone jackknife position. A time-out was completed verifying correct patient, procedure, site, positioning, and implant(s) and/or special equipment prior to beginning this procedure. The buttocks were taped apart.  The perineum was prepped and draped in standard sterile fashion. Local anesthetic was injected as a perianal block. An anoscope was introduced and the three hemorrhoidal pedicles were identified. A Kelly clamp was placed near the base of each pedicle near the dentate line and retracted externally to exteriorize the hemorrhoidal pedicles.  Each pedicle was excised in turn in the following fashion. An elliptical incision was made extending from perianal skin to anorectal ring including both internal and external hemorrhoids and excising a minimum amount of anoderm. Flaps were developed on both aspects of the incision, taking care to elevate only skin and mucosa. The dilated venous mass was dissected using Metzenbaum scissors from the underlying sphincter muscle. The base was ligated with a 2-0 Vicryl figure of 8 suture. The  pedicle was amputated from the base. Hemostasis was achieved using electrocautery. Following hemostasis, the skin and mucosal incisions were closed with a running lock stitch of 2-0 Vicryl on the mucosal aspect and converted to subcuticular once skin was encountered. The right anterior internal hemorrhoid pedicle was ligated with a figure of 8 2-0 Vicryl suture. The anal canal was then injected with local anesthetic. A gauze pad was tucked between the gluteal folds.  The patient tolerated the procedure well and was taken to the postanesthesia care unit in stable condition.   Specimen: Left lateral hemorrhoids, Right posterior hemorrhoid  Complications: none  EBL: 75mL

## 2017-11-26 NOTE — Anesthesia Preprocedure Evaluation (Signed)
Anesthesia Evaluation  Patient identified by MRN, date of birth, ID band Patient awake    Reviewed: Allergy & Precautions, NPO status , Patient's Chart, lab work & pertinent test results  History of Anesthesia Complications (+) AWARENESS UNDER ANESTHESIA and history of anesthetic complications (all episodes of awareness either during endoscopy or dental procedure in clinic)  Airway Mallampati: III  TM Distance: >3 FB Neck ROM: Full    Dental  (+) Poor Dentition   Pulmonary neg sleep apnea, neg COPD, Current Smoker,    breath sounds clear to auscultation- rhonchi (-) wheezing      Cardiovascular hypertension, Pt. on medications (-) CAD, (-) Past MI, (-) Cardiac Stents and (-) CABG  Rhythm:Regular Rate:Normal - Systolic murmurs and - Diastolic murmurs    Neuro/Psych Anxiety negative neurological ROS     GI/Hepatic GERD  ,Fatty liver   Endo/Other  negative endocrine ROSneg diabetes  Renal/GU negative Renal ROS     Musculoskeletal negative musculoskeletal ROS (+)   Abdominal (+) - obese,   Peds  Hematology negative hematology ROS (+)   Anesthesia Other Findings Past Medical History: No date: Chronic pain disorder No date: Complication of anesthesia     Comment:  has woken up during IV anesthesia No date: Fatty liver No date: GERD (gastroesophageal reflux disease) No date: Hypertension No date: Panic attack     Comment:  H/O No date: Varicose vein of leg     Comment:  right   Reproductive/Obstetrics                             Anesthesia Physical Anesthesia Plan  ASA: III  Anesthesia Plan: General   Post-op Pain Management:    Induction: Intravenous  PONV Risk Score and Plan: 0 and Ondansetron  Airway Management Planned: Oral ETT  Additional Equipment:   Intra-op Plan:   Post-operative Plan: Extubation in OR  Informed Consent: I have reviewed the patients History and  Physical, chart, labs and discussed the procedure including the risks, benefits and alternatives for the proposed anesthesia with the patient or authorized representative who has indicated his/her understanding and acceptance.   Dental advisory given  Plan Discussed with: CRNA and Anesthesiologist  Anesthesia Plan Comments:         Anesthesia Quick Evaluation

## 2017-11-26 NOTE — Anesthesia Post-op Follow-up Note (Signed)
Anesthesia QCDR form completed.        

## 2017-11-26 NOTE — Discharge Instructions (Signed)
Diet: Resume home heart healthy regular diet.   Activity: No heavy lifting >20 pounds (children, pets, laundry, garbage) or strenuous activity until follow-up, but light activity and walking are encouraged. Do not drive or drink alcohol if taking narcotic pain medications.  Avoid sitting in toilet for a prolonged time. Go to the bathroom just when have the urge.   Wound care: Remove dressing in tonight or before next bowel movement. Gauze inside the anal area will fall by itself. May shower with soapy water and pat dry (do not rub incisions), but no baths or submerging incision underwater until follow-up. (no swimming)   Medications: Resume all home medications. For mild to moderate pain: acetaminophen (Tylenol) or ibuprofen (if no kidney disease). Combining Tylenol with alcohol can substantially increase your risk of causing liver disease. Narcotic pain medications, if prescribed, can be used for severe pain, though may cause nausea, constipation, and drowsiness. Do not combine Tylenol and Percocet within a 6 hour period as Percocet contains Tylenol. If you do not need the narcotic pain medication, you do not need to fill the prescription.  New medications such as Metamucil (fiber supplement) and Polyethylene glycol are to make to stool softer and avoid hurting the wounds area. If develop diarrhea, stop taking it. Avoid constipation  Drink 8 glasses of water daily.   Call office [phone]) at any time if any questions, worsening pain, fevers/chills, bleeding, drainage from incision site, or other concerns.

## 2017-11-26 NOTE — Interval H&P Note (Signed)
History and Physical Interval Note:  11/26/2017 9:20 AM  Andre Wagner  has presented today for surgery, with the diagnosis of INTERNAL AND EXTERNAL THROMBOSED HEMORRHOIDS  The various methods of treatment have been discussed with the patient and family. After consideration of risks, benefits and other options for treatment, the patient has consented to  Procedure(s): EXAM UNDER ANESTHESIA WITH HEMORRHOIDECTOMY (N/A) as a surgical intervention .  The patient's history has been reviewed, patient examined, no change in status, stable for surgery.  I have reviewed the patient's chart and labs.  Questions were answered to the patient's satisfaction.     Carolan ShiverEdgardo Cintron-Diaz

## 2017-11-26 NOTE — Anesthesia Postprocedure Evaluation (Signed)
Anesthesia Post Note  Patient: Andre Wagner  Procedure(s) Performed: EXAM UNDER ANESTHESIA WITH HEMORRHOIDECTOMY (N/A )  Patient location during evaluation: PACU Anesthesia Type: General Level of consciousness: awake and alert and oriented Pain management: pain level controlled Vital Signs Assessment: post-procedure vital signs reviewed and stable Respiratory status: spontaneous breathing, nonlabored ventilation and respiratory function stable Cardiovascular status: blood pressure returned to baseline and stable Postop Assessment: no signs of nausea or vomiting Anesthetic complications: no     Last Vitals:  Vitals:   11/26/17 1056 11/26/17 1109  BP:    Pulse: 95 98  Resp: 11 18  Temp:    SpO2: 95% 96%    Last Pain:  Vitals:   11/26/17 1114  TempSrc:   PainSc: 8                  [name]

## 2017-11-26 NOTE — Progress Notes (Signed)
Patient ID: Andre Wagner, male   DOB: 08-22-1961, 56 y.o.   MRN: [id number]   Contacted by PACU nurse that patient is complaining of severe pain. The patient has history of drug abuse and is tolerant to multiple pain medications. Upon evaluation the patient refers significant burning pain on the perianal area. No bleeding.   Will admit the patient to observation for better pain control until pain can be controlled with oral medications.

## 2017-11-26 NOTE — Transfer of Care (Signed)
Immediate Anesthesia Transfer of Care Note  Patient: Nathaneil CanaryMark A Manning  Procedure(s) Performed: EXAM UNDER ANESTHESIA WITH HEMORRHOIDECTOMY (N/A )  Patient Location: PACU  Anesthesia Type:General  Level of Consciousness: sedated  Airway & Oxygen Therapy: Patient Spontanous Breathing and Patient connected to face mask oxygen  Post-op Assessment: Report given to RN and Post -op Vital signs reviewed and stable  Post vital signs: Reviewed and stable  Last Vitals:  Vitals:   11/26/17 0813 11/26/17 1031  BP: (!) 151/93 (!) 155/102  Pulse: (!) 106 95  Resp: 18 20  Temp: 37 C 37.3 C  SpO2: 99% 100%    Last Pain:  Vitals:   11/26/17 0813  TempSrc: Oral  PainSc: 4          Complications: No apparent anesthesia complications

## 2017-11-26 NOTE — Anesthesia Procedure Notes (Signed)
Procedure Name: Intubation Date/Time: 11/26/2017 9:12 AM Performed by: Letitia Neri, CRNA Pre-anesthesia Checklist: Patient identified, Patient being monitored, Timeout performed, Emergency Drugs available and Suction available Patient Re-evaluated:Patient Re-evaluated prior to induction Oxygen Delivery Method: Circle system utilized Preoxygenation: Pre-oxygenation with 100% oxygen Induction Type: IV induction Ventilation: Mask ventilation without difficulty Laryngoscope Size: Mac and 3 Grade View: Grade I Tube type: Oral Tube size: 7.5 mm Number of attempts: 1 Airway Equipment and Method: Stylet Placement Confirmation: ETT inserted through vocal cords under direct vision,  positive ETCO2 and breath sounds checked- equal and bilateral Secured at: 22 cm Tube secured with: Tape Dental Injury: Teeth and Oropharynx as per pre-operative assessment

## 2017-11-27 ENCOUNTER — Encounter: Payer: Self-pay | Admitting: General Surgery

## 2017-11-27 DIAGNOSIS — K641 Second degree hemorrhoids: Secondary | ICD-10-CM | POA: Diagnosis not present

## 2017-11-27 LAB — SURGICAL PATHOLOGY

## 2017-11-27 LAB — HIV ANTIBODY (ROUTINE TESTING W REFLEX): HIV SCREEN 4TH GENERATION: NONREACTIVE

## 2017-11-27 MED ORDER — OXYCODONE-ACETAMINOPHEN 7.5-325 MG PO TABS: 1.0000 | ORAL_TABLET | ORAL | 0 refills | Status: AC | PRN

## 2017-11-27 MED ORDER — ONDANSETRON HCL 4 MG/2ML IJ SOLN
INTRAMUSCULAR | Status: AC
  Filled 2017-11-27: qty 2

## 2017-11-27 NOTE — Final Progress Note (Signed)
SURGICAL DISCHARGE NOTE   Hospital Day(s): 0.   Post op day(s): 1 Day Post-Op.   Interval History: Patient seen and examined, no acute events or new complaints overnight. Patient reports improved pain. Denies bleeding.   Vital signs in last 24 hours: [min-max] current  Temp:  [98.1 F (36.7 C)-99.8 F (37.7 C)] 98.1 F (36.7 C) (12/06 0607) Pulse Rate:  [77-116] 77 (12/06 0607) Resp:  [11-29] 20 (12/06 0607) BP: (120-155)/(71-102) 124/75 (12/06 0607) SpO2:  [92 %-100 %] 95 % (12/06 0607)     Height: 5\' 6"  (167.6 cm) Weight: 77.1 kg (170 lb) BMI (Calculated): 27.45    Physical Exam:  Constitutional: alert, cooperative and no distress  Gastrointestinal: soft, non-tender, and non-distended RECTAL: no bleeding, no secretions.   Labs:  CBC Latest Ref Rng & Units 11/26/2017 11/02/2017 11/02/2017  WBC 3.8 - 10.6 K/uL 12.1(H) 11.9(H) 10.8(H)  Hemoglobin 13.0 - 18.0 g/dL 40.913.1 81.114.4 91.414.6  Hematocrit 40.0 - 52.0 % 40.3 45.0 44.8  Platelets 150 - 440 K/uL 192 348 353   CMP Latest Ref Rng & Units 11/26/2017 11/02/2017 07/17/2016  Glucose 65 - 99 mg/dL - 782(N156(H) 562(Z110(H)  BUN 6 - 20 mg/dL - <3(Y<5(L) 6  Creatinine 8.650.61 - 1.24 mg/dL 7.840.92 6.96(E0.51(L) 9.520.65  Sodium 135 - 145 mmol/L - 140 141  Potassium 3.5 - 5.1 mmol/L - 3.6 3.9  Chloride 101 - 111 mmol/L - 104 106  CO2 22 - 32 mmol/L - 24 27  Calcium 8.9 - 10.3 mg/dL - 8.8(L) 9.0  Total Protein 6.5 - 8.1 g/dL - 7.7 7.0  Total Bilirubin 0.3 - 1.2 mg/dL - 0.5 0.4  Alkaline Phos 38 - 126 U/L - 147(H) 98  AST 15 - 41 U/L - 94(H) 86(H)  ALT 17 - 63 U/L - 35 60    Assessment/Plan:  56 y.o. male  1 Day Post-Op s/p hemorrhoidectomy who was unable to control pain on PACU. Pain under control today. Will discharge home today.   All of the above findings and recommendations were discussed with the patient, and the medical team, and all of patient's questions were answered to his expressed satisfaction.

## 2017-11-27 NOTE — Progress Notes (Signed)
MD left a rx for tramadol for the patient for discharge.  The patient said that tramadol does not help.  I asked him what he thought would help and he said percocet.  Dr Hazle Quantintron-diaz notified and said to destroy tramadol rx and he wrote a new rx for percocet.  He also said for the patient to continue his norvasc if he was taking it home

## 2017-11-27 NOTE — Discharge Summary (Signed)
Physician Discharge Summary  Patient ID: Andre Wagner MRN: [id number] DOB/AGE: [birth year] 56 y.o.  Admit date: 11/26/2017 Discharge date: 11/27/2017  Admission Diagnoses: Acute post surgical pain                                         Status post hemorrhoidectomy  Discharge Diagnoses:  Active Problems:   Acute post-operative pain Status post hemorrhoidectomy  Discharged Condition: good  Hospital Course: Patient had hemorrhoidectomy yesterday. After surgery pain was unable to be controlled with multiple oral and IV pain medications and was unable to be discharged due to the uncontrolled pain. Today patient with better pain control. Will discharge home with oral pain medications.   Consults: None  Significant Diagnostic Studies: None  Treatments: analgesia: Dilaudid  Discharge Exam: Blood pressure 124/75, pulse 77, temperature 98.1 F (36.7 C), temperature source Oral, resp. rate 20, height 5\' 6"  (1.676 m), weight 77.1 kg (170 lb), SpO2 95 %. General: alert, active, oriented x3 Abdomen: soft and depressible, non tender Rectal: wound are clear and dry, No bleeding   Disposition: 01-Home or Self Care  Discharge Instructions    Diet - low sodium heart healthy   Complete by:  As directed    Increase activity slowly   Complete by:  As directed         Signed: Carolan ShiverEdgardo Cintron-Diaz 11/27/2017, 8:40 AM

## 2017-11-27 NOTE — Progress Notes (Signed)
Discharge instructions reviewed with the patient.  See prior note by me.  IV removed.  Patient being sent out via wheelchair to his moms car

## 2018-06-02 ENCOUNTER — Other Ambulatory Visit: Payer: Self-pay

## 2018-06-02 ENCOUNTER — Emergency Department: Payer: BLUE CROSS/BLUE SHIELD

## 2018-06-02 ENCOUNTER — Encounter: Payer: Self-pay | Admitting: Emergency Medicine

## 2018-06-02 ENCOUNTER — Observation Stay
Admission: EM | Admit: 2018-06-02 | Discharge: 2018-06-03 | Disposition: A | Discharge status: Home / Self Care | Payer: BLUE CROSS/BLUE SHIELD | Attending: Internal Medicine | Admitting: Internal Medicine

## 2018-06-02 DIAGNOSIS — Z885 Allergy status to narcotic agent status: Secondary | ICD-10-CM | POA: Insufficient documentation

## 2018-06-02 DIAGNOSIS — G8929 Other chronic pain: Secondary | ICD-10-CM | POA: Insufficient documentation

## 2018-06-02 DIAGNOSIS — Z79899 Other long term (current) drug therapy: Secondary | ICD-10-CM | POA: Insufficient documentation

## 2018-06-02 DIAGNOSIS — F410 Panic disorder [episodic paroxysmal anxiety] without agoraphobia: Secondary | ICD-10-CM | POA: Insufficient documentation

## 2018-06-02 DIAGNOSIS — F17210 Nicotine dependence, cigarettes, uncomplicated: Secondary | ICD-10-CM | POA: Diagnosis not present

## 2018-06-02 DIAGNOSIS — M47817 Spondylosis without myelopathy or radiculopathy, lumbosacral region: Secondary | ICD-10-CM | POA: Insufficient documentation

## 2018-06-02 DIAGNOSIS — S32029A Unspecified fracture of second lumbar vertebra, initial encounter for closed fracture: Secondary | ICD-10-CM | POA: Diagnosis not present

## 2018-06-02 DIAGNOSIS — M545 Low back pain, unspecified: Secondary | ICD-10-CM

## 2018-06-02 DIAGNOSIS — M5137 Other intervertebral disc degeneration, lumbosacral region: Secondary | ICD-10-CM | POA: Insufficient documentation

## 2018-06-02 DIAGNOSIS — K219 Gastro-esophageal reflux disease without esophagitis: Secondary | ICD-10-CM | POA: Insufficient documentation

## 2018-06-02 DIAGNOSIS — I10 Essential (primary) hypertension: Secondary | ICD-10-CM | POA: Diagnosis not present

## 2018-06-02 DIAGNOSIS — S40812A Abrasion of left upper arm, initial encounter: Secondary | ICD-10-CM | POA: Insufficient documentation

## 2018-06-02 DIAGNOSIS — S32020A Wedge compression fracture of second lumbar vertebra, initial encounter for closed fracture: Secondary | ICD-10-CM | POA: Diagnosis present

## 2018-06-02 DIAGNOSIS — S30810A Abrasion of lower back and pelvis, initial encounter: Secondary | ICD-10-CM | POA: Insufficient documentation

## 2018-06-02 LAB — BASIC METABOLIC PANEL
Anion gap: 12 (ref 5–15)
BUN: 6 mg/dL (ref 6–20)
CALCIUM: 9 mg/dL (ref 8.9–10.3)
CHLORIDE: 103 mmol/L (ref 101–111)
CO2: 26 mmol/L (ref 22–32)
CREATININE: 0.86 mg/dL (ref 0.61–1.24)
GFR calc non Af Amer: >60 mL/min (ref >60)
GLUCOSE: 152 mg/dL — AB (ref 65–99)
Potassium: 4.1 mmol/L (ref 3.5–5.1)
Sodium: 141 mmol/L (ref 135–145)

## 2018-06-02 LAB — CBC
HCT: 47.8 % (ref 40.0–52.0)
Hemoglobin: 16.4 g/dL (ref 13.0–18.0)
MCH: 28.9 pg (ref 26.0–34.0)
MCHC: 34.2 g/dL (ref 32.0–36.0)
MCV: 84.5 fL (ref 80.0–100.0)
PLATELETS: 244 10*3/uL (ref 150–440)
RBC: 5.66 MIL/uL (ref 4.40–5.90)
RDW: 17.9 % — ABNORMAL HIGH (ref 11.5–14.5)
WBC: 14.5 10*3/uL — ABNORMAL HIGH (ref 3.8–10.6)

## 2018-06-02 LAB — TSH: TSH: 1.743 u[IU]/mL (ref 0.350–4.500)

## 2018-06-02 MED ORDER — ONDANSETRON HCL 4 MG PO TABS
4.0000 mg | ORAL_TABLET | Freq: Four times a day (QID) | ORAL | Status: DC | PRN
Start: 2018-06-02 — End: 2018-06-03

## 2018-06-02 MED ORDER — DOCUSATE SODIUM 100 MG PO CAPS
100.0000 mg | ORAL_CAPSULE | Freq: Two times a day (BID) | ORAL | Status: DC
Start: 2018-06-02 — End: 2018-06-03
  Administered 2018-06-02 – 2018-06-03 (×2): 100 mg via ORAL
  Filled 2018-06-02 (×2): qty 1

## 2018-06-02 MED ORDER — DIPHENHYDRAMINE HCL 50 MG/ML IJ SOLN
12.5000 mg | Freq: Four times a day (QID) | INTRAMUSCULAR | Status: DC | PRN
Start: 2018-06-02 — End: 2018-06-03
  Administered 2018-06-03: 12.5 mg via INTRAVENOUS
  Filled 2018-06-02: qty 1

## 2018-06-02 MED ORDER — IOHEXOL 300 MG/ML  SOLN
100.0000 mL | Freq: Once | INTRAMUSCULAR | Status: AC | PRN
  Administered 2018-06-02: 100 mL via INTRAVENOUS

## 2018-06-02 MED ORDER — ACETAMINOPHEN 325 MG PO TABS: 650.0000 mg | ORAL_TABLET | Freq: Four times a day (QID) | ORAL | Status: DC | PRN

## 2018-06-02 MED ORDER — MORPHINE SULFATE (PF) 4 MG/ML IV SOLN
4.0000 mg | Freq: Once | INTRAVENOUS | Status: AC
Start: 2018-06-02 — End: 2018-06-02
  Administered 2018-06-02: 4 mg via INTRAVENOUS
  Filled 2018-06-02: qty 1

## 2018-06-02 MED ORDER — ENOXAPARIN SODIUM 40 MG/0.4ML ~~LOC~~ SOLN
40.0000 mg | SUBCUTANEOUS | Status: DC
Start: 2018-06-02 — End: 2018-06-03
  Administered 2018-06-02: 40 mg via SUBCUTANEOUS
  Filled 2018-06-02: qty 0.4

## 2018-06-02 MED ORDER — OXYCODONE-ACETAMINOPHEN 5-325 MG PO TABS
1.0000 | ORAL_TABLET | ORAL | Status: DC | PRN
  Administered 2018-06-02 – 2018-06-03 (×4): 1 via ORAL
  Filled 2018-06-02 (×4): qty 1

## 2018-06-02 MED ORDER — MORPHINE SULFATE (PF) 2 MG/ML IV SOLN
2.0000 mg | INTRAVENOUS | Status: DC | PRN
  Administered 2018-06-02 – 2018-06-03 (×2): 2 mg via INTRAVENOUS
  Filled 2018-06-02 (×2): qty 1

## 2018-06-02 MED ORDER — OXYCODONE-ACETAMINOPHEN 5-325 MG PO TABS
1.0000 | ORAL_TABLET | Freq: Once | ORAL | Status: AC
  Administered 2018-06-02: 1 via ORAL
  Filled 2018-06-02: qty 1

## 2018-06-02 MED ORDER — AMLODIPINE BESYLATE 5 MG PO TABS
5.0000 mg | ORAL_TABLET | Freq: Every day | ORAL | Status: DC
  Administered 2018-06-02: 5 mg via ORAL
  Filled 2018-06-02: qty 1

## 2018-06-02 MED ORDER — ACETAMINOPHEN 650 MG RE SUPP
650.0000 mg | Freq: Four times a day (QID) | RECTAL | Status: DC | PRN
Start: 2018-06-02 — End: 2018-06-03

## 2018-06-02 MED ORDER — ONDANSETRON HCL 4 MG/2ML IJ SOLN
4.0000 mg | Freq: Four times a day (QID) | INTRAMUSCULAR | Status: DC | PRN
Start: 2018-06-02 — End: 2018-06-03

## 2018-06-02 NOTE — ED Notes (Signed)
D/w Dr. Marisa SeverinSiadecki, new orders received for CT, and XRs

## 2018-06-02 NOTE — ED Notes (Signed)
Receive [name] RN
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
## 2018-06-03 LAB — URINALYSIS, COMPLETE (UACMP) WITH MICROSCOPIC
Bacteria, UA: NONE SEEN
Bilirubin Urine: NEGATIVE
Glucose, UA: 50 mg/dL — AB
Hgb urine dipstick: NEGATIVE
Ketones, ur: NEGATIVE mg/dL
Leukocytes, UA: NEGATIVE
Nitrite: NEGATIVE
Protein, ur: NEGATIVE mg/dL
Specific Gravity, Urine: >1.046 — ABNORMAL HIGH (ref 1.005–1.030)
Squamous Epithelial / LPF: NONE SEEN (ref 0–5)
pH: 7 (ref 5.0–8.0)

## 2018-06-03 MED ORDER — DOCUSATE SODIUM 100 MG PO CAPS: 100.0000 mg | ORAL_CAPSULE | Freq: Two times a day (BID) | ORAL | 0 refills | Status: DC

## 2018-06-03 MED ORDER — PREDNISONE 10 MG PO TABS: 10.0000 mg | ORAL_TABLET | Freq: Every day | ORAL | 0 refills | Status: DC

## 2018-06-03 MED ORDER — AMLODIPINE BESYLATE 10 MG PO TABS: 10.0000 mg | ORAL_TABLET | Freq: Every day | ORAL | 0 refills | Status: DC

## 2018-06-03 MED ORDER — OXYCODONE-ACETAMINOPHEN 5-325 MG PO TABS: 1.0000 | ORAL_TABLET | ORAL | 0 refills | Status: DC | PRN

## 2018-06-03 MED ORDER — DEXAMETHASONE SODIUM PHOSPHATE 4 MG/ML IJ SOLN
8.0000 mg | Freq: Once | INTRAMUSCULAR | Status: AC
  Administered 2018-06-03: 8 mg via INTRAVENOUS
  Filled 2018-06-03: qty 2

## 2018-06-03 MED ORDER — AMLODIPINE BESYLATE 10 MG PO TABS
10.0000 mg | ORAL_TABLET | Freq: Every day | ORAL | Status: DC
  Administered 2018-06-03: 10 mg via ORAL
  Filled 2018-06-03: qty 1
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
## 2018-06-03 NOTE — Progress Notes (Signed)
Pt being discharged home, back brace on, discharge instructions and prescriptions reviewed with pt, work note given, states understanding, pt with no complaints

## 2018-06-03 NOTE — Plan of Care (Signed)
  Problem: Education: Goal: Knowledge of General Education information will improve Outcome: Progressing   Problem: Health Behavior/Discharge Planning: Goal: Ability to manage health-related needs will improve Outcome: Progressing   Problem: Clinical Measurements: Goal: Ability to maintain clinical measurements within normal limits will improve Outcome: Progressing Goal: Will remain free from infection Outcome: Progressing Goal: Diagnostic test results will improve Outcome: Progressing Goal: Respiratory complications will improve Outcome: Progressing Goal: Cardiovascular complication will be avoided Outcome: Progressing   Problem: Activity: Goal: Risk for activity intolerance will decrease Outcome: Progressing   Problem: Nutrition: Goal: Adequate nutrition will be maintained Outcome: Progressing   Problem: Coping: Goal: Level of anxiety will decrease Outcome: Progressing   

## 2018-06-18 ENCOUNTER — Other Ambulatory Visit: Payer: Self-pay | Admitting: Student

## 2018-06-18 DIAGNOSIS — S32020D Wedge compression fracture of second lumbar vertebra, subsequent encounter for fracture with routine healing: Secondary | ICD-10-CM

## 2018-07-04 ENCOUNTER — Ambulatory Visit: Payer: BLUE CROSS/BLUE SHIELD

## 2018-07-20 ENCOUNTER — Emergency Department: Payer: Self-pay

## 2018-07-20 ENCOUNTER — Encounter: Payer: Self-pay | Admitting: Emergency Medicine

## 2018-07-20 ENCOUNTER — Other Ambulatory Visit: Payer: Self-pay

## 2018-07-20 DIAGNOSIS — F17210 Nicotine dependence, cigarettes, uncomplicated: Secondary | ICD-10-CM | POA: Insufficient documentation

## 2018-07-20 DIAGNOSIS — I10 Essential (primary) hypertension: Secondary | ICD-10-CM | POA: Insufficient documentation

## 2018-07-20 DIAGNOSIS — R079 Chest pain, unspecified: Secondary | ICD-10-CM | POA: Insufficient documentation

## 2018-07-20 DIAGNOSIS — Z79899 Other long term (current) drug therapy: Secondary | ICD-10-CM | POA: Insufficient documentation

## 2018-07-20 DIAGNOSIS — R112 Nausea with vomiting, unspecified: Secondary | ICD-10-CM | POA: Insufficient documentation

## 2018-07-20 DIAGNOSIS — R197 Diarrhea, unspecified: Secondary | ICD-10-CM | POA: Insufficient documentation

## 2018-07-20 DIAGNOSIS — F419 Anxiety disorder, unspecified: Secondary | ICD-10-CM | POA: Insufficient documentation

## 2018-07-20 DIAGNOSIS — R0602 Shortness of breath: Secondary | ICD-10-CM | POA: Insufficient documentation

## 2018-07-20 DIAGNOSIS — R1084 Generalized abdominal pain: Secondary | ICD-10-CM | POA: Insufficient documentation

## 2018-07-20 DIAGNOSIS — R000 Tachycardia, unspecified: Secondary | ICD-10-CM | POA: Insufficient documentation

## 2018-07-20 LAB — BASIC METABOLIC PANEL
ANION GAP: 15 (ref 5–15)
BUN: <5 mg/dL — ABNORMAL LOW (ref 6–20)
CO2: 28 mmol/L (ref 22–32)
Calcium: 10.1 mg/dL (ref 8.9–10.3)
Chloride: 93 mmol/L — ABNORMAL LOW (ref 98–111)
Creatinine, Ser: 0.6 mg/dL — ABNORMAL LOW (ref 0.61–1.24)
GFR calc Af Amer: >60 mL/min (ref >60)
GFR calc non Af Amer: >60 mL/min (ref >60)
GLUCOSE: 132 mg/dL — AB (ref 70–99)
POTASSIUM: 3.4 mmol/L — AB (ref 3.5–5.1)
Sodium: 136 mmol/L (ref 135–145)

## 2018-07-20 LAB — TROPONIN I: Troponin I: <0.03 ng/mL (ref <0.03)

## 2018-07-20 LAB — CBC
HEMATOCRIT: 49.6 % (ref 40.0–52.0)
HEMOGLOBIN: 16.9 g/dL (ref 13.0–18.0)
MCH: 29.8 pg (ref 26.0–34.0)
MCHC: 34.1 g/dL (ref 32.0–36.0)
MCV: 87.4 fL (ref 80.0–100.0)
Platelets: 278 10*3/uL (ref 150–440)
RBC: 5.68 MIL/uL (ref 4.40–5.90)
RDW: 17.6 % — ABNORMAL HIGH (ref 11.5–14.5)
WBC: 10.7 10*3/uL — ABNORMAL HIGH (ref 3.8–10.6)

## 2018-07-20 NOTE — ED Triage Notes (Signed)
Pt in via POV with complaints of sudden onset abdominal pain and chest pain which woke him up from sleep Sunday morning.  Pt reports nausea, vomiting, denies any diarrhea.  Pt reports associated shortness of breath, radiation into neck.  NAD noted at this time.

## 2018-07-21 ENCOUNTER — Emergency Department: Payer: Self-pay

## 2018-07-21 ENCOUNTER — Encounter: Payer: Self-pay | Admitting: Radiology

## 2018-07-21 ENCOUNTER — Emergency Department
Admission: EM | Admit: 2018-07-21 | Discharge: 2018-07-21 | Disposition: A | Discharge status: Home / Self Care | Payer: Self-pay | Attending: Emergency Medicine | Admitting: Emergency Medicine

## 2018-07-21 DIAGNOSIS — R112 Nausea with vomiting, unspecified: Secondary | ICD-10-CM

## 2018-07-21 DIAGNOSIS — R197 Diarrhea, unspecified: Secondary | ICD-10-CM

## 2018-07-21 DIAGNOSIS — R079 Chest pain, unspecified: Secondary | ICD-10-CM

## 2018-07-21 LAB — TROPONIN I: Troponin I: <0.03 ng/mL (ref <0.03)

## 2018-07-21 MED ORDER — ONDANSETRON 4 MG PO TBDP: ORAL_TABLET | ORAL | 0 refills | Status: DC

## 2018-07-21 MED ORDER — SODIUM CHLORIDE 0.9 % IV BOLUS
1000.0000 mL | Freq: Once | INTRAVENOUS | Status: AC
  Administered 2018-07-21: 1000 mL via INTRAVENOUS

## 2018-07-21 MED ORDER — ONDANSETRON HCL 4 MG/2ML IJ SOLN
4.0000 mg | INTRAMUSCULAR | Status: AC
  Administered 2018-07-21: 4 mg via INTRAVENOUS
  Filled 2018-07-21: qty 2

## 2018-07-21 MED ORDER — IOPAMIDOL (ISOVUE-370) INJECTION 76%
100.0000 mL | Freq: Once | INTRAVENOUS | Status: AC | PRN
  Administered 2018-07-21: 100 mL via INTRAVENOUS

## 2018-07-21 NOTE — ED Notes (Signed)
Patient transported to CT 

## 2018-07-21 NOTE — Discharge Instructions (Signed)
Your workup in the Emergency Department today was reassuring.  We did not find any specific abnormalities.  We recommend you drink plenty of fluids, take your regular medications and/or any new ones prescribed today, and follow up with the doctor(s) listed in these documents as recommended.  Return to the Emergency Department if you develop new or worsening symptoms that concern you.  

## 2018-07-21 NOTE — ED Notes (Signed)
Patient discharge and follow up information reviewed with patient by ED nursing staff and patient given the opportunity to ask questions pertaining to ED visit and discharge plan of care. Patient advised that should symptoms not continue to improve, resolve entirely, or should new symptoms develop then a follow up visit with their PCP or a return visit to the ED may be warranted. Patient verbalized consent and understanding of discharge plan of care including potential need for further evaluation. Patient being discharged in stable condition per attending ED physician on duty.   Signature pad is not responding, pt verbalizes DC instructions, follow up and prescription, no questions at discharge.

## 2018-07-21 NOTE — ED Notes (Signed)
Pt given sprite to drink. 
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
## 2018-08-01 ENCOUNTER — Other Ambulatory Visit: Payer: Self-pay

## 2018-08-01 ENCOUNTER — Encounter: Payer: Self-pay | Admitting: *Deleted

## 2018-08-01 ENCOUNTER — Emergency Department
Admission: EM | Admit: 2018-08-01 | Discharge: 2018-08-01 | Disposition: A | Discharge status: Home / Self Care | Payer: Self-pay | Attending: Emergency Medicine | Admitting: Emergency Medicine

## 2018-08-01 DIAGNOSIS — Z79899 Other long term (current) drug therapy: Secondary | ICD-10-CM | POA: Insufficient documentation

## 2018-08-01 DIAGNOSIS — T50905A Adverse effect of unspecified drugs, medicaments and biological substances, initial encounter: Secondary | ICD-10-CM | POA: Insufficient documentation

## 2018-08-01 DIAGNOSIS — G2571 Drug induced akathisia: Secondary | ICD-10-CM | POA: Insufficient documentation

## 2018-08-01 DIAGNOSIS — I10 Essential (primary) hypertension: Secondary | ICD-10-CM | POA: Insufficient documentation

## 2018-08-01 DIAGNOSIS — F17210 Nicotine dependence, cigarettes, uncomplicated: Secondary | ICD-10-CM | POA: Insufficient documentation

## 2018-08-01 DIAGNOSIS — K2950 Unspecified chronic gastritis without bleeding: Secondary | ICD-10-CM | POA: Insufficient documentation

## 2018-08-01 DIAGNOSIS — R110 Nausea: Secondary | ICD-10-CM

## 2018-08-01 LAB — COMPREHENSIVE METABOLIC PANEL
ALBUMIN: 4 g/dL (ref 3.5–5.0)
ALK PHOS: 111 U/L (ref 38–126)
ALT: 31 U/L (ref 0–44)
AST: 73 U/L — AB (ref 15–41)
Anion gap: 13 (ref 5–15)
BUN: 7 mg/dL (ref 6–20)
CALCIUM: 8.6 mg/dL — AB (ref 8.9–10.3)
CO2: 27 mmol/L (ref 22–32)
CREATININE: 0.7 mg/dL (ref 0.61–1.24)
Chloride: 100 mmol/L (ref 98–111)
GFR calc Af Amer: >60 mL/min (ref >60)
GFR calc non Af Amer: >60 mL/min (ref >60)
GLUCOSE: 149 mg/dL — AB (ref 70–99)
Potassium: 3.4 mmol/L — ABNORMAL LOW (ref 3.5–5.1)
SODIUM: 140 mmol/L (ref 135–145)
Total Bilirubin: 0.8 mg/dL (ref 0.3–1.2)
Total Protein: 7.6 g/dL (ref 6.5–8.1)

## 2018-08-01 LAB — CBC WITH DIFFERENTIAL/PLATELET
Basophils Absolute: 0 10*3/uL (ref 0–0.1)
Basophils Relative: 0 %
EOS ABS: 0.1 10*3/uL (ref 0–0.7)
Eosinophils Relative: 1 %
HCT: 45.6 % (ref 40.0–52.0)
HEMOGLOBIN: 15.6 g/dL (ref 13.0–18.0)
LYMPHS ABS: 1.5 10*3/uL (ref 1.0–3.6)
Lymphocytes Relative: 16 %
MCH: 30.3 pg (ref 26.0–34.0)
MCHC: 34.3 g/dL (ref 32.0–36.0)
MCV: 88.4 fL (ref 80.0–100.0)
MONOS PCT: 11 %
Monocytes Absolute: 1 10*3/uL (ref 0.2–1.0)
Neutro Abs: 6.4 10*3/uL (ref 1.4–6.5)
Neutrophils Relative %: 72 %
Platelets: 235 10*3/uL (ref 150–440)
RBC: 5.16 MIL/uL (ref 4.40–5.90)
RDW: 17.3 % — ABNORMAL HIGH (ref 11.5–14.5)
WBC: 8.9 10*3/uL (ref 3.8–10.6)

## 2018-08-01 LAB — LIPASE, BLOOD: Lipase: 41 U/L (ref 11–51)

## 2018-08-01 LAB — TROPONIN I: Troponin I: <0.03 ng/mL (ref <0.03)

## 2018-08-01 MED ORDER — SODIUM CHLORIDE 0.9 % IV BOLUS
1000.0000 mL | Freq: Once | INTRAVENOUS | Status: AC
  Administered 2018-08-01: 1000 mL via INTRAVENOUS

## 2018-08-01 MED ORDER — DIPHENHYDRAMINE HCL 50 MG/ML IJ SOLN
25.0000 mg | Freq: Once | INTRAMUSCULAR | Status: AC
  Administered 2018-08-01: 25 mg via INTRAVENOUS
  Filled 2018-08-01: qty 1

## 2018-08-01 MED ORDER — DIPHENHYDRAMINE HCL 50 MG/ML IJ SOLN
25.0000 mg | Freq: Once | INTRAMUSCULAR | Status: AC
  Administered 2018-08-01: 25 mg via INTRAVENOUS

## 2018-08-01 MED ORDER — GI COCKTAIL ~~LOC~~
30.0000 mL | Freq: Once | ORAL | Status: AC
  Administered 2018-08-01: 30 mL via ORAL
  Filled 2018-08-01: qty 30

## 2018-08-01 MED ORDER — FAMOTIDINE 20 MG PO TABS: 20.0000 mg | ORAL_TABLET | Freq: Two times a day (BID) | ORAL | 0 refills | Status: DC

## 2018-08-01 MED ORDER — HALOPERIDOL LACTATE 5 MG/ML IJ SOLN
2.5000 mg | Freq: Once | INTRAMUSCULAR | Status: AC
  Administered 2018-08-01: 2.5 mg via INTRAVENOUS
  Filled 2018-08-01: qty 1

## 2018-08-01 MED ORDER — DICYCLOMINE HCL 10 MG/ML IM SOLN
20.0000 mg | Freq: Once | INTRAMUSCULAR | Status: AC
  Administered 2018-08-01: 20 mg via INTRAMUSCULAR
  Filled 2018-08-01 (×2): qty 2

## 2018-08-01 NOTE — ED Triage Notes (Signed)
Pt c/o constant nausea, unrelieved by rx'd zofran since yesterday afternoon. Pt had last normal BM x 2 hrs ago. Pt is not vomiting.

## 2018-08-01 NOTE — Discharge Instructions (Addendum)
It was a pleasure to take care of you today, and thank you for coming to our emergency department.  If you have any questions or concerns before leaving please ask the nurse to grab me and I'm more than happy to go through your aftercare instructions again.  If you were prescribed any opioid pain medication today such as Norco, Vicodin, Percocet, morphine, hydrocodone, or oxycodone please make sure you do not drive when you are taking this medication as it can alter your ability to drive safely.  If you have any concerns once you are home that you are not improving or are in fact getting worse before you can make it to your follow-up appointment, please do not hesitate to call 911 and come back for further evaluation.  Merrily Brittle, MD  Results for orders placed or performed during the hospital encounter of 08/01/18  Comprehensive metabolic panel  Result Value Ref Range   Sodium 140 135 - 145 mmol/L   Potassium 3.4 (L) 3.5 - 5.1 mmol/L   Chloride 100 98 - 111 mmol/L   CO2 27 22 - 32 mmol/L   Glucose, Bld 149 (H) 70 - 99 mg/dL   BUN 7 6 - 20 mg/dL   Creatinine, Ser 1.61 0.61 - 1.24 mg/dL   Calcium 8.6 (L) 8.9 - 10.3 mg/dL   Total Protein 7.6 6.5 - 8.1 g/dL   Albumin 4.0 3.5 - 5.0 g/dL   AST 73 (H) 15 - 41 U/L   ALT 31 0 - 44 U/L   Alkaline Phosphatase 111 38 - 126 U/L   Total Bilirubin 0.8 0.3 - 1.2 mg/dL   GFR calc non Af Amer >60 >60 mL/min   GFR calc Af Amer >60 >60 mL/min   Anion gap 13 5 - 15  Lipase, blood  Result Value Ref Range   Lipase 41 11 - 51 U/L  CBC with Differential  Result Value Ref Range   WBC 8.9 3.8 - 10.6 K/uL   RBC 5.16 4.40 - 5.90 MIL/uL   Hemoglobin 15.6 13.0 - 18.0 g/dL   HCT 09.6 04.5 - 40.9 %   MCV 88.4 80.0 - 100.0 fL   MCH 30.3 26.0 - 34.0 pg   MCHC 34.3 32.0 - 36.0 g/dL   RDW 81.1 (H) 91.4 - 78.2 %   Platelets 235 150 - 440 K/uL   Neutrophils Relative % 72 %   Neutro Abs 6.4 1.4 - 6.5 K/uL   Lymphocytes Relative 16 %   Lymphs Abs 1.5 1.0 -  3.6 K/uL   Monocytes Relative 11 %   Monocytes Absolute 1.0 0.2 - 1.0 K/uL   Eosinophils Relative 1 %   Eosinophils Absolute 0.1 0 - 0.7 K/uL   Basophils Relative 0 %   Basophils Absolute 0.0 0 - 0.1 K/uL  Troponin I  Result Value Ref Range   Troponin I <0.03 <0.03 ng/mL   Dg Chest 2 View  Result Date: 07/20/2018 CLINICAL DATA:  Chest pain EXAM: CHEST - 2 VIEW COMPARISON:  08/08/2015, CT chest 06/21/2015 FINDINGS: No acute opacity or pleural effusion. Hyperinflation. Stable borderline cardiomegaly. No pneumothorax. Old right clavicle fracture. Rounded opacity projecting over the spine. IMPRESSION: No active cardiopulmonary disease. Rounded opacity projecting over the spine on the lateral view, not certain if this is due to a bony abnormality versus a lung nodule. Correlation with chest CT is suggested. Electronically Signed   By: Jasmine Pang M.D.   On: 07/20/2018 23:28   Ct Angio Chest Pe  W And/or Wo Contrast  Result Date: 07/21/2018 CLINICAL DATA:  Acute onset of chest pain, shortness of breath and tachycardia. Question lung nodule on chest radiograph. EXAM: CT ANGIOGRAPHY CHEST WITH CONTRAST TECHNIQUE: Multidetector CT imaging of the chest was performed using the standard protocol during bolus administration of intravenous contrast. Multiplanar CT image reconstructions and MIPs were obtained to evaluate the vascular anatomy. CONTRAST:  100mL ISOVUE-370 IOPAMIDOL (ISOVUE-370) INJECTION 76% COMPARISON:  Chest radiograph yesterday.  Chest CT 06/20/2014 FINDINGS: Cardiovascular: There are no filling defects within the pulmonary arteries to suggest pulmonary embolus. Thoracic aorta is normal in caliber with mild atherosclerosis. No dissection or aneurysm. Heart is upper normal in size, there are coronary artery calcifications. No pericardial effusion. Mediastinum/Nodes: Small mediastinal lymph nodes not enlarged by size criteria. No hilar adenopathy. No visualized thyroid nodule. The esophagus is  nondistended. Lungs/Pleura: Mild central bronchial thickening. No consolidation or focal airspace disease. No pulmonary edema. No pleural fluid. No pulmonary mass. Trachea and mainstem bronchi are patent. Upper Abdomen: Enlarged liver with steatosis. Abdominal findings characterized fully on dedicated abdominal CT performed concurrently. Musculoskeletal: Left lateral bulky osteophytes at T7-T8 causing the nodular appearance on chest radiograph. There are no acute or suspicious osseous abnormalities. Review of the MIP images confirms the above findings. IMPRESSION: 1. No pulmonary embolus.  No acute chest finding. 2. Bulky lateral osteophytes at T7-T8 account for the nodular density on radiograph. No suspicious pulmonary mass. 3. Chronic bronchial thickening. 4. Aortic Atherosclerosis (ICD10-I70.0). Coronary artery calcifications. Electronically Signed   By: Rubye OaksMelanie  Ehinger M.D.   On: 07/21/2018 02:29   Ct Abdomen Pelvis W Contrast  Result Date: 07/21/2018 CLINICAL DATA:  Sudden onset of abdominal pain. Nausea and vomiting. EXAM: CT ABDOMEN AND PELVIS WITH CONTRAST TECHNIQUE: Multidetector CT imaging of the abdomen and pelvis was performed using the standard protocol following bolus administration of intravenous contrast. CONTRAST:  100mL ISOVUE-370 IOPAMIDOL (ISOVUE-370) INJECTION 76% COMPARISON:  Abdominal CT 06/02/2018.  Abdominal CT 10/11/2016 FINDINGS: Lower chest: Assistant detail on dedicated chest CT performed concurrently. No pleural fluid or focal airspace disease. Hepatobiliary: The liver is enlarged with diffusely decreased hepatic density, craniocaudal dimension 20 cm. More focal decreased density in the periphery of the liver likely focal fatty infiltration. 10 mm enhancing focus in the subcapsular right lobe of the liver is unchanged from prior exam. Gallbladder physiologically distended, no calcified stone. No biliary dilatation. Pancreas: No peripancreatic inflammation. Stable pancreatic ductal  prominence from prior exam. Spleen: Normal in size without focal abnormality. Adrenals/Urinary Tract: No adrenal nodule. No hydronephrosis. Unchanged symmetric bilateral perinephric edema. Urinary bladder is physiologically distended without bladder wall thickening. Stomach/Bowel: Stomach partially distended. No bowel wall thickening, inflammatory change or obstruction. Normal appendix. Vascular/Lymphatic: Aortic and branch atherosclerosis. No aneurysm. No acute vascular finding. No enlarged abdominal or pelvic lymph nodes. Reproductive: Heterogeneous prostate gland spanning 4 cm. Other: No free air, free fluid, or intra-abdominal fluid collection. Fat in the right inguinal canal. Musculoskeletal: Unchanged appearance of L2 compression fracture from prior exam. Degenerative disc disease facet arthropathy in the lower lumbar spine. IMPRESSION: 1. No acute findings in the abdomen or pelvis. 2. Hepatic steatosis and hepatomegaly. Unchanged 1 cm enhancing focus in the right lobe of the liver since 2017 CT, likely flash filling hemangioma. 3.  Aortic Atherosclerosis (ICD10-I70.0). Electronically Signed   By: Rubye OaksMelanie  Ehinger M.D.   On: 07/21/2018 02:39
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
## 2019-03-25 HISTORY — PX: FACIAL FRACTURE SURGERY: SHX1570

## 2019-04-16 ENCOUNTER — Emergency Department
Admission: EM | Admit: 2019-04-16 | Discharge: 2019-04-17 | Disposition: A | Discharge status: Home / Self Care | Payer: No Typology Code available for payment source | Attending: Emergency Medicine | Admitting: Emergency Medicine

## 2019-04-16 ENCOUNTER — Other Ambulatory Visit: Payer: Self-pay

## 2019-04-16 DIAGNOSIS — Y906 Blood alcohol level of 120-199 mg/100 ml: Secondary | ICD-10-CM | POA: Insufficient documentation

## 2019-04-16 DIAGNOSIS — I10 Essential (primary) hypertension: Secondary | ICD-10-CM | POA: Diagnosis not present

## 2019-04-16 DIAGNOSIS — R413 Other amnesia: Secondary | ICD-10-CM | POA: Insufficient documentation

## 2019-04-16 DIAGNOSIS — R251 Tremor, unspecified: Secondary | ICD-10-CM | POA: Diagnosis not present

## 2019-04-16 DIAGNOSIS — F1020 Alcohol dependence, uncomplicated: Secondary | ICD-10-CM | POA: Insufficient documentation

## 2019-04-16 DIAGNOSIS — F17210 Nicotine dependence, cigarettes, uncomplicated: Secondary | ICD-10-CM | POA: Insufficient documentation

## 2019-04-16 DIAGNOSIS — R51 Headache: Secondary | ICD-10-CM | POA: Insufficient documentation

## 2019-04-16 DIAGNOSIS — S0292XD Unspecified fracture of facial bones, subsequent encounter for fracture with routine healing: Secondary | ICD-10-CM | POA: Insufficient documentation

## 2019-04-16 DIAGNOSIS — Z79899 Other long term (current) drug therapy: Secondary | ICD-10-CM | POA: Diagnosis not present

## 2019-04-16 LAB — CBC
HCT: 48.5 % (ref 39.0–52.0)
Hemoglobin: 15.9 g/dL (ref 13.0–17.0)
MCH: 31.2 pg (ref 26.0–34.0)
MCHC: 32.8 g/dL (ref 30.0–36.0)
MCV: 95.3 fL (ref 80.0–100.0)
Platelets: 142 10*3/uL — ABNORMAL LOW (ref 150–400)
RBC: 5.09 MIL/uL (ref 4.22–5.81)
RDW: 13.5 % (ref 11.5–15.5)
WBC: 8.3 10*3/uL (ref 4.0–10.5)
nRBC: 0 % (ref 0.0–0.2)
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
## 2019-04-16 NOTE — ED Notes (Signed)
Patient now states "fever" at home to 99.9. Patient has a cough that he states is his acid relux.

## 2019-04-17 ENCOUNTER — Emergency Department: Payer: No Typology Code available for payment source

## 2019-04-17 LAB — COMPREHENSIVE METABOLIC PANEL
ALT: 23 U/L (ref 0–44)
AST: 50 U/L — ABNORMAL HIGH (ref 15–41)
Albumin: 3.9 g/dL (ref 3.5–5.0)
Alkaline Phosphatase: 124 U/L (ref 38–126)
Anion gap: 15 (ref 5–15)
BUN: <5 mg/dL — ABNORMAL LOW (ref 6–20)
CO2: 24 mmol/L (ref 22–32)
Calcium: 8.5 mg/dL — ABNORMAL LOW (ref 8.9–10.3)
Chloride: 98 mmol/L (ref 98–111)
Creatinine, Ser: 0.54 mg/dL — ABNORMAL LOW (ref 0.61–1.24)
GFR calc Af Amer: >60 mL/min (ref >60)
GFR calc non Af Amer: >60 mL/min (ref >60)
Glucose, Bld: 110 mg/dL — ABNORMAL HIGH (ref 70–99)
Potassium: 3.4 mmol/L — ABNORMAL LOW (ref 3.5–5.1)
Sodium: 137 mmol/L (ref 135–145)
Total Bilirubin: 0.8 mg/dL (ref 0.3–1.2)
Total Protein: 7.4 g/dL (ref 6.5–8.1)

## 2019-04-17 LAB — GLUCOSE, CAPILLARY: Glucose-Capillary: 116 mg/dL — ABNORMAL HIGH (ref 70–99)

## 2019-04-17 LAB — LIPASE, BLOOD: Lipase: 55 U/L — ABNORMAL HIGH (ref 11–51)

## 2019-04-17 LAB — ETHANOL: Alcohol, Ethyl (B): 152 mg/dL — ABNORMAL HIGH (ref <10)

## 2019-04-17 MED ORDER — SODIUM CHLORIDE 0.9 % IV BOLUS
1000.0000 mL | Freq: Once | INTRAVENOUS | Status: AC
  Administered 2019-04-17: 1000 mL via INTRAVENOUS

## 2019-04-17 MED ORDER — CHLORDIAZEPOXIDE HCL 25 MG PO CAPS
25.0000 mg | ORAL_CAPSULE | Freq: Once | ORAL | Status: AC
  Administered 2019-04-17: 25 mg via ORAL
  Filled 2019-04-17: qty 1

## 2019-04-17 MED ORDER — LORAZEPAM 1 MG PO TABS
1.0000 mg | ORAL_TABLET | Freq: Once | ORAL | Status: AC
  Administered 2019-04-17: 03:00:00 1 mg via ORAL
  Filled 2019-04-17: qty 1

## 2019-04-17 MED ORDER — PANTOPRAZOLE SODIUM 40 MG IV SOLR
40.0000 mg | Freq: Once | INTRAVENOUS | Status: AC
Start: 2019-04-17 — End: 2019-04-17
  Administered 2019-04-17: 40 mg via INTRAVENOUS
  Filled 2019-04-17: qty 40

## 2019-04-17 NOTE — ED Notes (Signed)
Peripheral IV discontinued. Catheter intact. No signs of infiltration or redness. Gauze applied to IV site.  Discharge instructions reviewed with patient. Questions fielded by this RN. Patient verbalizes understanding of instructions. Patient discharged home in stable condition per [place]. No acute distress noted at time of discharge.   

## 2019-04-17 NOTE — ED Notes (Signed)
Call to [place], TTS
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
## 2019-04-17 NOTE — ED Notes (Signed)
Sig pad inop

## 2019-04-17 NOTE — BH Assessment (Signed)
This Clinical research associate called [facility] on behalf of the pt to see if there were any available beds tonight. l spoke with Alinda Money [phone] who states that there are beds available but that the pt has to call and schedule an appointment himself with the RN on duty. Mervin states that the RN will be in at 7 am but will begin accepting phone calls for appointment times at 9 am. This information was given to the pt by this write prior to making the phone call however, the pt asked that I call to see if there was something that could be done tonight. Unfortunately, the RN has to be the one to do the assessment and he won't be in until 7am. They will be expecting Andre Wagner's call.

## 2019-04-17 NOTE — BH Assessment (Signed)
Per request of ER MD Scotty Court), writer provided the pt. with information and instructions on how to access [facility]patient [facility] ([facility] [employer])  [facility] will speak with pt via tele-assessment [phone] Patient denies SI/HI and AV/H.    [facility] [address]  [place], [place] [address] [phone]  [facility] [address],  [place], [place] [address] [phone]  [facility] [address],  [place], [place] [address] [phone]   [facility] [address]  [place], [place] [address] [phone]   [facility] [address], [place] [phone]   [facility] [address] [place],  [place], [place] [address] [phone]   [facility] [address] [place], [place] [address] Phone Number: [phone]

## 2019-04-17 NOTE — ED Notes (Signed)
Pt returned from CT °

## 2019-04-17 NOTE — ED Notes (Signed)
ED Provider at bedside.  Pt c/o of loss of appetite, "Losing time - unable to recal days or hours, pt reports heavy ETOH use and voicing concern for "DTs", last drink 1/2 pint of wine at noon, right side HA

## 2019-04-17 NOTE — ED Notes (Signed)
Patient transported to CT 

## 2019-04-29 ENCOUNTER — Inpatient Hospital Stay
Admission: EM | Admit: 2019-04-29 | Discharge: 2019-05-01 | DRG: 603 | Disposition: A | Discharge status: Other Acute Inpatient Hospital | Payer: No Typology Code available for payment source | Attending: Internal Medicine | Admitting: Internal Medicine

## 2019-04-29 ENCOUNTER — Emergency Department: Payer: Self-pay

## 2019-04-29 ENCOUNTER — Other Ambulatory Visit: Payer: Self-pay

## 2019-04-29 DIAGNOSIS — Z716 Tobacco abuse counseling: Secondary | ICD-10-CM

## 2019-04-29 DIAGNOSIS — K760 Fatty (change of) liver, not elsewhere classified: Secondary | ICD-10-CM | POA: Diagnosis present

## 2019-04-29 DIAGNOSIS — Z20828 Contact with and (suspected) exposure to other viral communicable diseases: Secondary | ICD-10-CM | POA: Diagnosis present

## 2019-04-29 DIAGNOSIS — Z885 Allergy status to narcotic agent status: Secondary | ICD-10-CM

## 2019-04-29 DIAGNOSIS — F332 Major depressive disorder, recurrent severe without psychotic features: Secondary | ICD-10-CM | POA: Diagnosis present

## 2019-04-29 DIAGNOSIS — F1024 Alcohol dependence with alcohol-induced mood disorder: Secondary | ICD-10-CM | POA: Diagnosis present

## 2019-04-29 DIAGNOSIS — L03011 Cellulitis of right finger: Principal | ICD-10-CM | POA: Diagnosis present

## 2019-04-29 DIAGNOSIS — I872 Venous insufficiency (chronic) (peripheral): Secondary | ICD-10-CM | POA: Diagnosis present

## 2019-04-29 DIAGNOSIS — F10230 Alcohol dependence with withdrawal, uncomplicated: Secondary | ICD-10-CM | POA: Diagnosis present

## 2019-04-29 DIAGNOSIS — G8921 Chronic pain due to trauma: Secondary | ICD-10-CM | POA: Diagnosis present

## 2019-04-29 DIAGNOSIS — F329 Major depressive disorder, single episode, unspecified: Secondary | ICD-10-CM

## 2019-04-29 DIAGNOSIS — F10229 Alcohol dependence with intoxication, unspecified: Secondary | ICD-10-CM | POA: Diagnosis present

## 2019-04-29 DIAGNOSIS — F1410 Cocaine abuse, uncomplicated: Secondary | ICD-10-CM | POA: Diagnosis present

## 2019-04-29 DIAGNOSIS — I10 Essential (primary) hypertension: Secondary | ICD-10-CM | POA: Diagnosis present

## 2019-04-29 DIAGNOSIS — W540XXA Bitten by dog, initial encounter: Secondary | ICD-10-CM

## 2019-04-29 DIAGNOSIS — L089 Local infection of the skin and subcutaneous tissue, unspecified: Secondary | ICD-10-CM | POA: Diagnosis present

## 2019-04-29 DIAGNOSIS — R45851 Suicidal ideations: Secondary | ICD-10-CM

## 2019-04-29 DIAGNOSIS — F319 Bipolar disorder, unspecified: Secondary | ICD-10-CM | POA: Diagnosis present

## 2019-04-29 DIAGNOSIS — H5461 Unqualified visual loss, right eye, normal vision left eye: Secondary | ICD-10-CM | POA: Diagnosis present

## 2019-04-29 DIAGNOSIS — F4310 Post-traumatic stress disorder, unspecified: Secondary | ICD-10-CM | POA: Diagnosis present

## 2019-04-29 DIAGNOSIS — M542 Cervicalgia: Secondary | ICD-10-CM | POA: Diagnosis present

## 2019-04-29 DIAGNOSIS — Z23 Encounter for immunization: Secondary | ICD-10-CM

## 2019-04-29 DIAGNOSIS — G4700 Insomnia, unspecified: Secondary | ICD-10-CM | POA: Diagnosis present

## 2019-04-29 DIAGNOSIS — S61259A Open bite of unspecified finger without damage to nail, initial encounter: Secondary | ICD-10-CM

## 2019-04-29 DIAGNOSIS — F32A Depression, unspecified: Secondary | ICD-10-CM

## 2019-04-29 DIAGNOSIS — F1010 Alcohol abuse, uncomplicated: Secondary | ICD-10-CM

## 2019-04-29 DIAGNOSIS — F10929 Alcohol use, unspecified with intoxication, unspecified: Secondary | ICD-10-CM

## 2019-04-29 DIAGNOSIS — Y908 Blood alcohol level of 240 mg/100 ml or more: Secondary | ICD-10-CM | POA: Diagnosis present

## 2019-04-29 DIAGNOSIS — F17210 Nicotine dependence, cigarettes, uncomplicated: Secondary | ICD-10-CM | POA: Diagnosis present

## 2019-04-29 LAB — CBC
HCT: 50.6 % (ref 39.0–52.0)
Hemoglobin: 16.3 g/dL (ref 13.0–17.0)
MCH: 30.8 pg (ref 26.0–34.0)
MCHC: 32.2 g/dL (ref 30.0–36.0)
MCV: 95.5 fL (ref 80.0–100.0)
Platelets: 278 10*3/uL (ref 150–400)
RBC: 5.3 MIL/uL (ref 4.22–5.81)
RDW: 14.4 % (ref 11.5–15.5)
WBC: 7.9 10*3/uL (ref 4.0–10.5)
nRBC: 0 % (ref 0.0–0.2)

## 2019-04-29 LAB — COMPREHENSIVE METABOLIC PANEL
ALT: 25 U/L (ref 0–44)
AST: 52 U/L — ABNORMAL HIGH (ref 15–41)
Albumin: 3.9 g/dL (ref 3.5–5.0)
Alkaline Phosphatase: 116 U/L (ref 38–126)
Anion gap: 14 (ref 5–15)
BUN: <5 mg/dL — ABNORMAL LOW (ref 6–20)
CO2: 25 mmol/L (ref 22–32)
Calcium: 8.4 mg/dL — ABNORMAL LOW (ref 8.9–10.3)
Chloride: 105 mmol/L (ref 98–111)
Creatinine, Ser: 0.56 mg/dL — ABNORMAL LOW (ref 0.61–1.24)
GFR calc Af Amer: >60 mL/min (ref >60)
GFR calc non Af Amer: >60 mL/min (ref >60)
Glucose, Bld: 122 mg/dL — ABNORMAL HIGH (ref 70–99)
Potassium: 3.6 mmol/L (ref 3.5–5.1)
Sodium: 144 mmol/L (ref 135–145)
Total Bilirubin: 0.6 mg/dL (ref 0.3–1.2)
Total Protein: 7.5 g/dL (ref 6.5–8.1)

## 2019-04-29 LAB — ETHANOL: Alcohol, Ethyl (B): 321 mg/dL (ref <10)

## 2019-04-29 LAB — SALICYLATE LEVEL: Salicylate Lvl: <7 mg/dL (ref 2.8–30.0)

## 2019-04-29 LAB — ACETAMINOPHEN LEVEL: Acetaminophen (Tylenol), Serum: <10 ug/mL — ABNORMAL LOW (ref 10–30)

## 2019-04-29 MED ORDER — TETANUS-DIPHTH-ACELL PERTUSSIS 5-2.5-18.5 LF-MCG/0.5 IM SUSP
0.5000 mL | Freq: Once | INTRAMUSCULAR | Status: AC
  Administered 2019-04-29: 0.5 mL via INTRAMUSCULAR
  Filled 2019-04-29: qty 0.5

## 2019-04-29 MED ORDER — AMOXICILLIN-POT CLAVULANATE 875-125 MG PO TABS
1.0000 | ORAL_TABLET | Freq: Once | ORAL | Status: AC
  Administered 2019-04-29: 1 via ORAL
  Filled 2019-04-29: qty 1

## 2019-04-29 MED ORDER — SULFAMETHOXAZOLE-TRIMETHOPRIM 800-160 MG PO TABS
2.0000 | ORAL_TABLET | Freq: Once | ORAL | Status: AC
  Administered 2019-04-29: 2 via ORAL
  Filled 2019-04-29: qty 2

## 2019-04-29 NOTE — ED Notes (Addendum)
Pt dressing out for behavioral health.  Two packs of [employer] 20s cigarettes One bic lighter with pizza stickers One [employer] ge[employer] debit visa One [employer] phone, gold in color One pair grey tennis shoes One collared button down shirt, blue in color One pair of denim pants, blue in color One black leather like belt with metal buckle, silver in color.  One set of keys with six individual keys and a silver colored metal ring  All belongings placed in belongings bag and labeled with pt sticker.

## 2019-04-29 NOTE — ED Notes (Signed)

## 2019-04-29 NOTE — ED Provider Notes (Signed)
[facility] Emergency Department Provider Note  ____________________________________________   First MD Initiated Contact with Patient 04/29/19 2312     (approximate)  I have reviewed the triage vital signs and the nursing notes.   HISTORY  Chief Complaint Suicidal    HPI Andre Wagner is a 58 y.o. male with medical history as listed below who presents under involuntary commitment for wanting to die.  He drinks alcohol regularly but says that has nothing to do with it.  He suffers from chronic pain after multiple traumatic injuries.  He says that he feels helpless and hopeless and he engaged in an altercation with law enforcement and says he wanted the cops to kill him.  He continues to want to die and feels he has no other choice.  He denies any other symptoms including fever/chills, chest pain, shortness of breath, cough, sore throat, nausea, vomiting, and abdominal pain.  He was bitten by the dog of a friend of his on the right hand more than 24 hours ago and has some pain in his right index finger along 1 of the knuckles with some swelling and redness.  He says that he washed out the wound as soon as it happened and put on some hydrogen peroxide but it is swollen up since then.  It has been wrapped but the injury occurred close to 36 hours ago.  He denies any allergies to antibiotics.  Overall he reports his symptoms are severe and nothing in particular is making them better or worse.         Past Medical History:  Diagnosis Date  . Chronic pain disorder   . Complication of anesthesia    has woken up during IV anesthesia  . Fatty liver   . GERD (gastroesophageal reflux disease)   . Hypertension   . Panic attack    H/O  . Varicose vein of leg    right    Patient Active Problem List   Diagnosis Date Noted  . Abrasion of right index finger with infection 04/30/2019  . Suicidal ideations 04/30/2019  . Compression fracture of L2 vertebra (HCC)  06/02/2018  . Acute post-operative pain 11/26/2017  . Varicose veins of leg with pain, right 04/03/2017  . Chronic venous insufficiency 04/03/2017  . Leg pain 04/03/2017  . Essential hypertension 04/03/2017    Past Surgical History:  Procedure Laterality Date  . BACK SURGERY    . EVALUATION UNDER ANESTHESIA WITH HEMORRHOIDECTOMY N/A 11/26/2017   Procedure: EXAM UNDER ANESTHESIA WITH HEMORRHOIDECTOMY;  Surgeon: Carolan Shiverintron-Diaz, Edgardo, MD;  Location: [facility] ORS;  Service: General;  Laterality: N/A;  . FACIAL FRACTURE SURGERY    . FRACTURE SURGERY    . HARVEST BONE GRAFT     from right hip to repair fracture of the mandible  . KNEE ARTHROSCOPY Right 08/15/2017   Procedure: RIGHT KNEE ARTHROSCOPY  WITH PARTIAL MEDIAL MENISECTOMY, CHONDROPLASTY AND MICROFRACTURE OF LATERAL FEMORAL CONDYLE;  Surgeon: Erin SonsKernodle, Harold, MD;  [facility];  Service: Orthopedics;  Laterality: Right;  ROB BAGWELL  . NASAL SINUS SURGERY    . UMBILICAL HERNIA REPAIR N/A 10/25/2016   Procedure: HERNIA REPAIR UMBILICAL ADULT;  Surgeon: Nadeen LandauJarvis Wilton Smith, MD;  Location: [facility] ORS;  Service: General;  Laterality: N/A;    Prior to Admission medications   Medication Sig Start Date End Date Taking? Authorizing Provider  amLODipine (NORVASC) 5 MG tablet Take 5 mg by mouth daily. 04/06/19  Yes [provider]    Allergies  Codeine  No family history on file.  Social History Social History   Tobacco Use  . Smoking status: Current Every Day Smoker    Packs/day: 0.50    Years: 25.00    Pack years: 12.50    Types: Cigarettes  . Smokeless tobacco: Never Used  Substance Use Topics  . Alcohol use: Yes    Comment: occassionally  . Drug use: No    Comment: states his gf smokes crack in house, but he doesnt.     Review of Systems Constitutional: No fever/chills Eyes: No visual changes. ENT: No sore throat. Cardiovascular: Denies chest pain. Respiratory: Denies shortness of breath.  Gastrointestinal: No abdominal pain.  No nausea, no vomiting.  No diarrhea.  No constipation. Genitourinary: Negative for dysuria. Musculoskeletal: Chronic pain in his neck and back and face.  Subacute dog bite to the right hand with some scratches on his right arm with a wound on his right index finger. Integumentary: Dog bite on right index finger with some scratches on his arm.  Neurological: Negative for headaches, focal weakness or numbness.   ____________________________________________   PHYSICAL EXAM:  VITAL SIGNS: ED Triage Vitals  Enc Vitals Group     BP 04/29/19 2237 132/90     Pulse Rate 04/29/19 2237 (!) 104     Resp 04/29/19 2237 17     Temp 04/29/19 2237 98.4 F (36.9 C)     Temp src --      SpO2 04/29/19 2237 97 %     Weight 04/29/19 2233 78 kg (171 lb 15.3 oz)     Height 04/29/19 2233 1.676 m ( )     Head Circumference --      Peak Flow --      Pain Score 04/29/19 2233 3     Pain Loc --      Pain Edu? --      Excl. in GC? --     Constitutional: Alert and oriented.  Disheveled but generally well-appearing and in no distress. Eyes: Conjunctivae are normal.  Head: Atraumatic. Nose: No congestion/rhinnorhea. Mouth/Throat: Mucous membranes are moist. Neck: No stridor.  No meningeal signs.   Cardiovascular: Normal rate, regular rhythm. Good peripheral circulation. Grossly normal heart sounds. Respiratory: Normal respiratory effort.  No retractions. No audible wheezing. Gastrointestinal: Soft and nontender. No distention.  Musculoskeletal: No lower extremity tenderness nor edema. No gross deformities of extremities. Neurologic:  Normal speech and language. No gross focal neurologic deficits are appreciated.  Skin:  Skin is warm and dry.  He has a dog bite on the PIP of the right index finger that is a little over centimeter in length with some surrounding swelling and tenderness.  The range of motion of that finger is limited.  He is holding it in extension  rather than slight flexion but he has pain with passive flexion and extension as well as tenderness along the tendon sheath. Psychiatric: Mood and affect are generally normal.  He continues to endorse suicidal ideation and depression.  ____________________________________________   LABS (all labs ordered are listed, but only abnormal results are displayed)  Labs Reviewed  COMPREHENSIVE METABOLIC PANEL - Abnormal; Notable for the following components:      Result Value   Glucose, Bld 122 (*)    BUN <5 (*)    Creatinine, Ser 0.56 (*)    Calcium 8.4 (*)    AST 52 (*)    All other components within normal limits  ETHANOL - Abnormal; Notable for the  following components:   Alcohol, Ethyl (B) 321 (*)    All other components within normal limits  ACETAMINOPHEN LEVEL - Abnormal; Notable for the following components:   Acetaminophen (Tylenol), Serum <10 (*)    All other components within normal limits  SARS CORONAVIRUS 2 (HOSPITAL ORDER, PERFORMED IN [facility])  SALICYLATE LEVEL  CBC  URINE DRUG SCREEN, QUALITATIVE ([facility] ONLY)  HIV ANTIBODY (ROUTINE TESTING W REFLEX)  BASIC METABOLIC PANEL  CBC   ____________________________________________  EKG  None - EKG not ordered by ED physician ____________________________________________  RADIOLOGY Marylou Mccoy, personally viewed and evaluated these images (plain radiographs) as part of my medical decision making, as well as reviewing the written report by the radiologist.  ED MD interpretation: Soft tissue swelling, no bony involvement or foreign body such as a fragment of dog tooth  Official radiology report(s): Dg Finger Index Right  Result Date: 04/29/2019 CLINICAL DATA:  Dog bite with swelling EXAM: RIGHT INDEX FINGER 2+V COMPARISON:  None. FINDINGS: No fracture or malalignment. Joint spaces are normal. No radiopaque foreign body. Soft tissue swelling is present IMPRESSION: Soft tissue swelling without acute osseous  abnormality Electronically Signed   By: Jasmine Pang M.D.   On: 04/29/2019 23:33    ____________________________________________   PROCEDURES   Procedure(s) performed (including Critical Care):  Procedures   ____________________________________________   INITIAL IMPRESSION / MDM / ASSESSMENT AND PLAN / ED COURSE  As part of my medical decision making, I reviewed the following data within the electronic MEDICAL RECORD NUMBER Nursing notes reviewed and incorporated, Labs reviewed , Old chart reviewed, Discussed with orthopedics (Dr. Rosita Kea), Discussed with admitting physician (Dr. Arville Care), A consult was requested and obtained from this/these consultant(s) Psychiatry and Notes from prior ED visits      *BADR CRISANTOS was evaluated in Emergency Department on 04/30/2019 for the symptoms described in the history of present illness. He was evaluated in the context of the global COVID-19 pandemic, which necessitated consideration that the patient might be at risk for infection with the SARS-CoV-2 virus that causes COVID-19. Institutional protocols and algorithms that pertain to the evaluation of patients at risk for COVID-19 are in a state of rapid change based on information released by regulatory bodies including the CDC and federal and state organizations. These policies and algorithms were followed during the patient's care in the ED.*  Differential diagnosis includes, but is not limited to, depression, suicidal ideation, substance-induced mood disorder, alcohol intoxication and abuse, wound infection from dog bite, flexor tenosynovitis.  The patient's vitals are stable with mild tachycardia. Afebrile. Stable BP. No hypoxemia. Denies COVID-19 symptoms.  Labs are notable for essentially normal CMP, acetaminophen, salicylate, and CBC.  He is exhibiting signs of both depression and substance-induced mood disorder and I have upheld his involuntary commitment and ordered a psych consult.  However, from  the medical perspective, he has a subacute dog bite to the right index finger that is now infected.  He has several of the [place] signs of flexor tendon sheath infection but I believe he does not yet suffer from flexor tenosynovitis but he is at severe risk.  Initially I ordered Augmentin and Bactrim for him, but upon further consideration I believe that he needs inpatient treatment and IV antibiotics as well as orthopedics consult.  I called and spoke by phone with Dr. Rosita Kea with the orthopedic service and discussed the case.  He agreed with my plan including giving the patient a dose of Zosyn 3.375  g IV and vancomycin 1 g IV.  He will see the patient tomorrow.  I am waiting the opportunity to discuss the case with the hospitalist service.  Of note, the nursing staff is investigating with law enforcement and animal control to try to find the dog and verify whether or not it has been immunized against rabies.  The patient also received a Tdap injection in the ED.  Clinical Course as of Apr 30 251  Fri Apr 30, 2019  0027 I discussed the case by phone with Dr. Arville Care with the hospitalist service.  He understands and agrees with the plan.   [CF]  0045 I spoke in person with Annice Pih the nurse practitioner with psychiatry.  The patient has changes story and to her he is denying suicidal ideation.  While I agree that his primary issue is substance-induced mood disorder, the IVC paperwork clearly indicates his belligerence with law enforcement and his attempts to be killed by them, and he told me in person that he wants to die and wanted the police to kill him.  Given his current intoxication I do not feel it is safe to take him off involuntary commitment until he can be reassessed again by psychiatry when he is no longer intoxicated and no longer changing his story minute to minute.  Annice Pih agrees with this and psychiatry will reassess him when he is in the hospital and sober. CIWA protocol is in place.  I ordered  thiamine 100 mg IV in addition to the antibiotics mentioned previously.   [CF]  0248 SARS Coronavirus 2: NEGATIVE [CF]    Clinical Course User Index [CF] Loleta Rose, MD     ____________________________________________  FINAL CLINICAL IMPRESSION(S) / ED DIAGNOSES  Final diagnoses:  Finger infection  Open wound of finger of right hand due to dog bite  Alcoholic intoxication with complication (HCC)  Alcohol abuse  Suicidal ideation  Depression, unspecified depression type     MEDICATIONS GIVEN DURING THIS VISIT:  Medications  oxyCODONE-acetaminophen (PERCOCET/ROXICET) 5-325 MG per tablet 1 tablet (has no administration in time range)  amLODipine (NORVASC) tablet 10 mg (has no administration in time range)  famotidine (PEPCID) tablet 20 mg (has no administration in time range)  enoxaparin (LOVENOX) injection 40 mg (has no administration in time range)  acetaminophen (TYLENOL) tablet 650 mg (has no administration in time range)    Or  acetaminophen (TYLENOL) suppository 650 mg (has no administration in time range)  traZODone (DESYREL) tablet 50 mg (has no administration in time range)  magnesium hydroxide (MILK OF MAGNESIA) suspension 30 mL (has no administration in time range)  ondansetron (ZOFRAN) tablet 4 mg (has no administration in time range)    Or  ondansetron (ZOFRAN) injection 4 mg (has no administration in time range)  folic acid (FOLVITE) tablet 1 mg (has no administration in time range)  multivitamin with minerals tablet 1 tablet (has no administration in time range)  thiamine (VITAMIN B-1) tablet 100 mg (has no administration in time range)  amoxicillin-clavulanate (AUGMENTIN) 875-125 MG per tablet 1 tablet (1 tablet Oral Not Given 04/30/19 0216)  sulfamethoxazole-trimethoprim (BACTRIM) 400-80 MG per tablet 1 tablet (1 tablet Oral Not Given 04/30/19 0217)  amoxicillin-clavulanate (AUGMENTIN) 875-125 MG per tablet 1 tablet (1 tablet Oral Given 04/29/19 2330)  Tdap  (BOOSTRIX) injection 0.5 mL (0.5 mLs Intramuscular Given 04/29/19 2330)  sulfamethoxazole-trimethoprim (BACTRIM DS) 800-160 MG per tablet 2 tablet (2 tablets Oral Given 04/29/19 2353)  piperacillin-tazobactam (ZOSYN) IVPB 3.375 g (0 g Intravenous  Stopped 04/30/19 0122)  thiamine (B-1) injection 100 mg (100 mg Intravenous Given 04/30/19 0045)  vancomycin (VANCOCIN) IVPB 1000 mg/200 mL premix (1,000 mg Intravenous New Bag/Given 04/30/19 0133)     ED Discharge Orders    None       Note:  This document was prepared using Dragon voice recognition software and may include unintentional dictation errors.   Loleta Rose, MD 04/30/19 [phone]

## 2019-04-29 NOTE — ED Triage Notes (Signed)
Patient coming in under IVC for SI. Patient reportedly told officer he wanted him to run him over and/or shoot him. Patient verifies that he did in fact say these things.  Patient also has dog bites to right hand/forearm that occurred yesterday. Notable swelling to index finger right and.

## 2019-04-30 ENCOUNTER — Encounter: Payer: Self-pay | Admitting: *Deleted

## 2019-04-30 DIAGNOSIS — F332 Major depressive disorder, recurrent severe without psychotic features: Secondary | ICD-10-CM | POA: Diagnosis not present

## 2019-04-30 DIAGNOSIS — L089 Local infection of the skin and subcutaneous tissue, unspecified: Secondary | ICD-10-CM | POA: Diagnosis present

## 2019-04-30 DIAGNOSIS — F10230 Alcohol dependence with withdrawal, uncomplicated: Secondary | ICD-10-CM | POA: Diagnosis not present

## 2019-04-30 DIAGNOSIS — R45851 Suicidal ideations: Secondary | ICD-10-CM

## 2019-04-30 LAB — URINE DRUG SCREEN, QUALITATIVE (ARMC ONLY)
Amphetamines, Ur Screen: NOT DETECTED
Barbiturates, Ur Screen: NOT DETECTED
Benzodiazepine, Ur Scrn: POSITIVE — AB
Cannabinoid 50 Ng, Ur ~~LOC~~: NOT DETECTED
Cocaine Metabolite,Ur ~~LOC~~: NOT DETECTED
MDMA (Ecstasy)Ur Screen: NOT DETECTED
Methadone Scn, Ur: NOT DETECTED
Opiate, Ur Screen: NOT DETECTED
Phencyclidine (PCP) Ur S: NOT DETECTED
Tricyclic, Ur Screen: NOT DETECTED

## 2019-04-30 LAB — BASIC METABOLIC PANEL
Anion gap: 12 (ref 5–15)
BUN: 5 mg/dL — ABNORMAL LOW (ref 6–20)
CO2: 26 mmol/L (ref 22–32)
Calcium: 8.2 mg/dL — ABNORMAL LOW (ref 8.9–10.3)
Chloride: 103 mmol/L (ref 98–111)
Creatinine, Ser: 0.51 mg/dL — ABNORMAL LOW (ref 0.61–1.24)
GFR calc Af Amer: >60 mL/min (ref >60)
GFR calc non Af Amer: >60 mL/min (ref >60)
Glucose, Bld: 123 mg/dL — ABNORMAL HIGH (ref 70–99)
Potassium: 3.2 mmol/L — ABNORMAL LOW (ref 3.5–5.1)
Sodium: 141 mmol/L (ref 135–145)

## 2019-04-30 LAB — CBC
HCT: 46 % (ref 39.0–52.0)
Hemoglobin: 15 g/dL (ref 13.0–17.0)
MCH: 31.1 pg (ref 26.0–34.0)
MCHC: 32.6 g/dL (ref 30.0–36.0)
MCV: 95.2 fL (ref 80.0–100.0)
Platelets: 256 10*3/uL (ref 150–400)
RBC: 4.83 MIL/uL (ref 4.22–5.81)
RDW: 14.4 % (ref 11.5–15.5)
WBC: 8.2 10*3/uL (ref 4.0–10.5)
nRBC: 0 % (ref 0.0–0.2)

## 2019-04-30 LAB — SARS CORONAVIRUS 2 BY RT PCR (HOSPITAL ORDER, PERFORMED IN ~~LOC~~ HOSPITAL LAB): SARS Coronavirus 2: NEGATIVE

## 2019-04-30 MED ORDER — ADULT MULTIVITAMIN W/MINERALS CH
1.0000 | ORAL_TABLET | Freq: Every day | ORAL | Status: DC
  Administered 2019-04-30: 09:00:00 1 via ORAL
  Filled 2019-04-30: qty 1

## 2019-04-30 MED ORDER — TRAZODONE HCL 50 MG PO TABS: 50.0000 mg | ORAL_TABLET | Freq: Every evening | ORAL | Status: DC | PRN

## 2019-04-30 MED ORDER — ONDANSETRON HCL 4 MG PO TABS: 4.0000 mg | ORAL_TABLET | Freq: Four times a day (QID) | ORAL | Status: DC | PRN

## 2019-04-30 MED ORDER — POTASSIUM CHLORIDE CRYS ER 20 MEQ PO TBCR
20.0000 meq | EXTENDED_RELEASE_TABLET | Freq: Two times a day (BID) | ORAL | Status: DC
  Administered 2019-04-30 – 2019-05-01 (×3): 20 meq via ORAL
  Filled 2019-04-30 (×3): qty 1

## 2019-04-30 MED ORDER — SULFAMETHOXAZOLE-TRIMETHOPRIM 400-80 MG PO TABS
1.0000 | ORAL_TABLET | Freq: Two times a day (BID) | ORAL | Status: DC
  Administered 2019-04-30 – 2019-05-01 (×3): 1 via ORAL
  Filled 2019-04-30 (×5): qty 1

## 2019-04-30 MED ORDER — ONDANSETRON HCL 4 MG/2ML IJ SOLN: 4.0000 mg | Freq: Four times a day (QID) | INTRAMUSCULAR | Status: DC | PRN

## 2019-04-30 MED ORDER — VITAMIN B-1 100 MG PO TABS
100.0000 mg | ORAL_TABLET | Freq: Every day | ORAL | Status: DC
  Administered 2019-04-30 – 2019-05-01 (×2): 100 mg via ORAL
  Filled 2019-04-30 (×2): qty 1

## 2019-04-30 MED ORDER — THIAMINE HCL 100 MG/ML IJ SOLN: 100.0000 mg | Freq: Every day | INTRAMUSCULAR | Status: DC

## 2019-04-30 MED ORDER — LORAZEPAM 1 MG PO TABS: 1.0000 mg | ORAL_TABLET | Freq: Once | ORAL | Status: DC

## 2019-04-30 MED ORDER — DILTIAZEM HCL ER COATED BEADS 180 MG PO CP24
180.0000 mg | ORAL_CAPSULE | Freq: Every day | ORAL | Status: DC
  Administered 2019-04-30 – 2019-05-01 (×2): 180 mg via ORAL
  Filled 2019-04-30 (×2): qty 1

## 2019-04-30 MED ORDER — PNEUMOCOCCAL VAC POLYVALENT 25 MCG/0.5ML IJ INJ
0.5000 mL | INJECTION | INTRAMUSCULAR | Status: AC
  Administered 2019-05-01: 0.5 mL via INTRAMUSCULAR
  Filled 2019-04-30: qty 0.5

## 2019-04-30 MED ORDER — THIAMINE HCL 100 MG/ML IJ SOLN
100.0000 mg | Freq: Once | INTRAMUSCULAR | Status: AC
  Administered 2019-04-30: 100 mg via INTRAVENOUS
  Filled 2019-04-30: qty 2

## 2019-04-30 MED ORDER — LORAZEPAM 2 MG PO TABS
0.0000 mg | ORAL_TABLET | Freq: Four times a day (QID) | ORAL | Status: DC
  Administered 2019-04-30: 23:00:00 2 mg via ORAL
  Administered 2019-05-01: 1 mg via ORAL
  Administered 2019-05-01: 2 mg via ORAL
  Filled 2019-04-30 (×3): qty 1

## 2019-04-30 MED ORDER — FOLIC ACID 1 MG PO TABS
1.0000 mg | ORAL_TABLET | Freq: Every day | ORAL | Status: DC
  Administered 2019-04-30 – 2019-05-01 (×2): 1 mg via ORAL
  Filled 2019-04-30 (×2): qty 1

## 2019-04-30 MED ORDER — DEXMEDETOMIDINE HCL IN NACL 200 MCG/50ML IV SOLN: 0.2000 ug/kg/h | INTRAVENOUS | Status: DC

## 2019-04-30 MED ORDER — ACETAMINOPHEN 325 MG PO TABS: 650.0000 mg | ORAL_TABLET | Freq: Four times a day (QID) | ORAL | Status: DC | PRN

## 2019-04-30 MED ORDER — AMLODIPINE BESYLATE 10 MG PO TABS
10.0000 mg | ORAL_TABLET | Freq: Every day | ORAL | Status: DC
  Administered 2019-04-30: 10 mg via ORAL
  Filled 2019-04-30: qty 1

## 2019-04-30 MED ORDER — ENOXAPARIN SODIUM 40 MG/0.4ML ~~LOC~~ SOLN
40.0000 mg | SUBCUTANEOUS | Status: DC
  Administered 2019-04-30 – 2019-05-01 (×2): 40 mg via SUBCUTANEOUS
  Filled 2019-04-30 (×2): qty 0.4

## 2019-04-30 MED ORDER — ACETAMINOPHEN 650 MG RE SUPP: 650.0000 mg | Freq: Four times a day (QID) | RECTAL | Status: DC | PRN

## 2019-04-30 MED ORDER — VANCOMYCIN HCL IN DEXTROSE 1-5 GM/200ML-% IV SOLN
1000.0000 mg | Freq: Once | INTRAVENOUS | Status: AC
  Administered 2019-04-30: 1000 mg via INTRAVENOUS
  Filled 2019-04-30: qty 200

## 2019-04-30 MED ORDER — LORAZEPAM 2 MG PO TABS: 0.0000 mg | ORAL_TABLET | Freq: Two times a day (BID) | ORAL | Status: DC

## 2019-04-30 MED ORDER — AMOXICILLIN-POT CLAVULANATE 875-125 MG PO TABS
1.0000 | ORAL_TABLET | Freq: Two times a day (BID) | ORAL | Status: DC
  Administered 2019-04-30 – 2019-05-01 (×3): 1 via ORAL
  Filled 2019-04-30 (×3): qty 1

## 2019-04-30 MED ORDER — VITAMIN B-1 100 MG PO TABS: 100.0000 mg | ORAL_TABLET | Freq: Every day | ORAL | Status: DC

## 2019-04-30 MED ORDER — OXYCODONE-ACETAMINOPHEN 5-325 MG PO TABS
1.0000 | ORAL_TABLET | ORAL | Status: DC | PRN
  Administered 2019-04-30 (×2): 1 via ORAL
  Administered 2019-05-01: 10:00:00 2 via ORAL
  Administered 2019-05-01: 07:00:00 1 via ORAL
  Filled 2019-04-30: qty 1
  Filled 2019-04-30: qty 2
  Filled 2019-04-30 (×2): qty 1

## 2019-04-30 MED ORDER — FAMOTIDINE 20 MG PO TABS
20.0000 mg | ORAL_TABLET | Freq: Two times a day (BID) | ORAL | Status: DC
  Administered 2019-04-30 – 2019-05-01 (×3): 20 mg via ORAL
  Filled 2019-04-30 (×3): qty 1

## 2019-04-30 MED ORDER — LORAZEPAM 2 MG/ML IJ SOLN: 1.0000 mg | INTRAMUSCULAR | Status: DC | PRN

## 2019-04-30 MED ORDER — LORAZEPAM 2 MG/ML IJ SOLN
1.0000 mg | Freq: Four times a day (QID) | INTRAMUSCULAR | Status: DC | PRN
  Administered 2019-04-30 (×3): 1 mg via INTRAVENOUS
  Filled 2019-04-30 (×3): qty 1

## 2019-04-30 MED ORDER — OXYCODONE-ACETAMINOPHEN 5-325 MG PO TABS
1.0000 | ORAL_TABLET | ORAL | Status: DC | PRN
  Administered 2019-04-30: 1 via ORAL
  Filled 2019-04-30: qty 1

## 2019-04-30 MED ORDER — VANCOMYCIN HCL 10 G IV SOLR
1.0000 g | Freq: Once | INTRAVENOUS | Status: DC
  Administered 2019-04-30: 02:00:00 1 g via INTRAVENOUS

## 2019-04-30 MED ORDER — LORAZEPAM 1 MG PO TABS: 1.0000 mg | ORAL_TABLET | Freq: Four times a day (QID) | ORAL | Status: DC | PRN

## 2019-04-30 MED ORDER — MAGNESIUM HYDROXIDE 400 MG/5ML PO SUSP: 30.0000 mL | Freq: Every day | ORAL | Status: DC | PRN

## 2019-04-30 MED ORDER — ADULT MULTIVITAMIN W/MINERALS CH
1.0000 | ORAL_TABLET | Freq: Every day | ORAL | Status: DC
  Administered 2019-05-01: 1 via ORAL
  Filled 2019-04-30: qty 1

## 2019-04-30 MED ORDER — FOLIC ACID 1 MG PO TABS: 1.0000 mg | ORAL_TABLET | Freq: Every day | ORAL | Status: DC

## 2019-04-30 MED ORDER — PIPERACILLIN-TAZOBACTAM 3.375 G IVPB 30 MIN
3.3750 g | Freq: Once | INTRAVENOUS | Status: AC
  Administered 2019-04-30: 3.375 g via INTRAVENOUS
  Filled 2019-04-30: qty 50

## 2019-04-30 NOTE — ED Notes (Signed)
BEHAVIORAL HEALTH ROUNDING Patient sleeping: No. Patient alert and oriented: yes Behavior appropriate: Yes.  ; If no, describe:  Nutrition and fluids offered: yes Toileting and hygiene offered: Yes  Sitter present: q15 minute observations and security  monitoring Law enforcement present: Yes  ODS  

## 2019-04-30 NOTE — ED Notes (Signed)
Pt reports that the dog that bit him belongs to [employer]anna [employer]ider  [address]  [place]  [phone]  Information provided to police so that they may investigate if dog has had it vaccine for rabies

## 2019-04-30 NOTE — Consult Note (Signed)
Reason for consult is right index finger infection  History: Patient is a 58 year old who suffered a dog bite Wednesday with a dog that a friend owned.  He did not seek treatment at that time he was came to emergency room with psych issues and was noted to have swelling to the finger.  X-rays were obtained that show soft tissue swelling particularly on the dorsum of the PIP joint but no bony involvement.  Physical exam: There is fusiform swelling to the finger no fluctuance no erythema other than slight increase compared to the rest of the hand but no lymphangitis.  He is able to flex the IP joints of the right index finger to about 30 degrees MCP joint about 45 degrees he can fully extend and with passive flexion he can be brought to about 3 cm from his fingertip to palm with moderate pain.  Sensation is intact.  There is an abrasion to the dorsum of the finger approximately 2 cm long longitudinal over the PIP joint.  There is also some abrasions to the forearm volarly.  Impression is cellulitis with potential for this to become a flexor tenosynovitis abscess  Recommendation is will let him eat at this point I do not think he is a surgical candidate at present although this could develop into an abscess.  Also recommended adding heat to increase the blood flow to the area and encouraged him to work on range of motion as tolerated.

## 2019-04-30 NOTE — ED Notes (Signed)
BEHAVIORAL HEALTH ROUNDING Patient sleeping: Yes.   Patient alert and oriented: eyes closed  Appears to be asleep Behavior appropriate: Yes.  ; If no, describe:  Nutrition and fluids offered: Yes  Toileting and hygiene offered: sleeping Sitter present: q 15 minute observations and security monitoring Law enforcement present: yes  ODS 

## 2019-04-30 NOTE — ED Notes (Signed)
Psychiatry NP is currently at his bedside

## 2019-04-30 NOTE — Progress Notes (Signed)
Heat compression applied to right finger as ordered

## 2019-04-30 NOTE — Plan of Care (Signed)
  Problem: Activity: Goal: Risk for activity intolerance will decrease Outcome: Progressing   

## 2019-04-30 NOTE — ED Notes (Signed)
Admitting MD is currently at his bedside
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
## 2019-04-30 NOTE — Progress Notes (Signed)
Nutrition Brief Note  Patient identified on the Malnutrition Screening Tool (MST) Report  58 y.o. Caucasian male with a known history of bipolar disorder and PTSD as well as GERD, chronic pain and panic attack, who presented to the emergency room with acute onset of suicidal ideation and right index finger swelling and pain after having a dog bite.    Wt Readings from Last 15 Encounters:  04/30/19 74.4 kg  04/16/19 77.1 kg  08/01/18 79.4 kg  07/20/18 78 kg  06/03/18 77.4 kg  11/26/17 77.1 kg  11/10/17 77.1 kg  11/06/17 77.1 kg  11/02/17 77.1 kg  09/25/17 82.2 kg  08/15/17 81.2 kg  07/03/17 82.1 kg  04/03/17 81.6 kg  10/25/16 81.6 kg  10/18/16 83 kg    Body mass index is 26.49 kg/m. Patient meets criteria for overweight based on current BMI.   Current diet order is HH, patient is consuming approximately 80% of meals at this time. Labs and medications reviewed.   No nutrition interventions warranted at this time. If nutrition issues arise, please consult RD.   Betsey Holiday MS, RD, LDN Pager #- [phone] Office#- [phone] After Hours Pager: [phone]
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
## 2019-04-30 NOTE — Progress Notes (Signed)
[employer] [facility] [facility] [place] [facility]   PATIENT NAME: Andre Wagner    MR#:  [id number]  DATE OF BIRTH:  05/03/1961  SUBJECTIVE:  CHIEF COMPLAINT:   Chief Complaint  Patient presents with  . Suicidal   And had dog bite on the right index finger and have some swelling there.  He is alcoholic but not in significant withdrawal currently.  He had suicidal thoughts before coming to hospital. REVIEW OF SYSTEMS:  CONSTITUTIONAL: No fever, fatigue or weakness.  EYES: No blurred or double vision.  EARS, NOSE, AND THROAT: No tinnitus or ear pain.  RESPIRATORY: No cough, shortness of breath, wheezing or hemoptysis.  CARDIOVASCULAR: No chest pain, orthopnea, edema.  GASTROINTESTINAL: No nausea, vomiting, diarrhea or abdominal pain.  GENITOURINARY: No dysuria, hematuria.  ENDOCRINE: No polyuria, nocturia,  HEMATOLOGY: No anemia, easy bruising or bleeding SKIN: No rash or lesion. MUSCULOSKELETAL: No joint pain or arthritis.   NEUROLOGIC: No tingling, numbness, weakness.  PSYCHIATRY: No anxiety or depression.   ROS  DRUG ALLERGIES:   Allergies  Allergen Reactions  . Codeine Itching    Ok w/ Benadryl    VITALS:  Blood pressure (!) 158/91, pulse (!) 111, temperature 98.5 F (36.9 C), resp. rate 18, height 5\' 6"  (1.676 m), weight 74.4 kg, SpO2 91 %.  PHYSICAL EXAMINATION:  GENERAL:  58 y.o.-year-old patient lying in the bed with no acute distress.  EYES: Pupils equal, round, reactive to light and accommodation. No scleral icterus. Extraocular muscles intact.  HEENT: Head atraumatic, normocephalic. Oropharynx and nasopharynx clear.  NECK:  Supple, no jugular venous distention. No thyroid enlargement, no tenderness.  LUNGS: Normal breath sounds bilaterally, no wheezing, rales,rhonchi or crepitation. No use of accessory muscles of respiration.  CARDIOVASCULAR: S1, S2 normal. No murmurs, rubs, or gallops.  ABDOMEN: Soft, nontender, nondistended. Bowel sounds present. No  organomegaly or mass.  EXTREMITIES: No pedal edema, cyanosis, or clubbing.  Right index finger is swollen and has bite Olaf at interphalangeal joints. NEUROLOGIC: Cranial nerves II through XII are intact. Muscle strength 5/5 in all extremities. Sensation intact. Gait not checked.  Patient have some shaking. PSYCHIATRIC: The patient is alert and oriented x 3.  SKIN: No obvious rash, lesion, or ulcer.   Physical Exam LABORATORY PANEL:   CBC Recent Labs  Lab 04/30/19 0620  WBC 8.2  HGB 15.0  HCT 46.0  PLT 256   ------------------------------------------------------------------------------------------------------------------  Chemistries  Recent Labs  Lab 04/29/19 2246 04/30/19 0620  NA 144 141  K 3.6 3.2*  CL 105 103  CO2 25 26  GLUCOSE 122* 123*  BUN <5* 5*  CREATININE 0.56* 0.51*  CALCIUM 8.4* 8.2*  AST 52*  --   ALT 25  --   ALKPHOS 116  --   BILITOT 0.6  --    ------------------------------------------------------------------------------------------------------------------  Cardiac Enzymes No results for input(s): TROPONINI in the last 168 hours. ------------------------------------------------------------------------------------------------------------------  RADIOLOGY:  Dg Finger Index Right  Result Date: 04/29/2019 CLINICAL DATA:  Dog bite with swelling EXAM: RIGHT INDEX FINGER 2+V COMPARISON:  None. FINDINGS: No fracture or malalignment. Joint spaces are normal. No radiopaque foreign body. Soft tissue swelling is present IMPRESSION: Soft tissue swelling without acute osseous abnormality Electronically Signed   By: Jasmine PangKim  Fujinaga M.D.   On: 04/29/2019 23:33    ASSESSMENT AND PLAN:   Principal Problem:   Suicidal ideations Active Problems:   Abrasion of right index finger with infection  1.  Suicidal ideation.  The patient was involuntarily committed and  will have a psychiatry consultation for further evaluation.  Sitter in the room.  2.  Right index  finger infected abrasion due to a dog bite.    Augmentin and Bactrim , pt does not want IV antibiotics.  An orthopedic consultation obtained by Dr. Rosita Kea - no need for surgery right now.\  3.  Alcohol intoxication.   monitored for alcohol withdrawal.  Multivitamins, folate and thiamine will be provided.  4.  Hypertension.  Amlodipine continued  5.  Polysubstance abuse including tobacco, cocaine in addition to alcohol.  He was counseled for cessation.  6.  DVT prophylaxis.  subcutaneous Lovenox  7. Tobacco abuse- councelled for 4 min.  All the records are reviewed and case discussed with Care Management/Social Workerr. Management plans discussed with the patient, family and they are in agreement.  CODE STATUS: Full.  TOTAL TIME TAKING CARE OF THIS PATIENT: 35 minutes.   POSSIBLE D/C IN 1-2 DAYS, DEPENDING ON CLINICAL CONDITION.  Altamese Dilling M.D on 04/30/2019    Between 7am to 6pm - Pager - [phone]  After 6pm go to [email or web address] - password [email or web address]  [facility]  Office  [phone]  CC: Primary care physician; Rayetta Humphrey, MD  Note: This dictation was prepared with Dragon dictation along with smaller phrase technology. Any transcriptional errors that result from this process are unintentional.
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
## 2019-05-01 ENCOUNTER — Inpatient Hospital Stay
Admission: AD | Admit: 2019-05-01 | Discharge: 2019-05-05 | DRG: 885 | Disposition: A | Discharge status: Home / Self Care | Payer: No Typology Code available for payment source | Source: Intra-hospital | Attending: Psychiatry | Admitting: Psychiatry

## 2019-05-01 ENCOUNTER — Other Ambulatory Visit: Payer: Self-pay

## 2019-05-01 DIAGNOSIS — S60410A Abrasion of right index finger, initial encounter: Secondary | ICD-10-CM

## 2019-05-01 DIAGNOSIS — R45851 Suicidal ideations: Secondary | ICD-10-CM | POA: Diagnosis present

## 2019-05-01 DIAGNOSIS — L089 Local infection of the skin and subcutaneous tissue, unspecified: Secondary | ICD-10-CM

## 2019-05-01 DIAGNOSIS — F10230 Alcohol dependence with withdrawal, uncomplicated: Secondary | ICD-10-CM | POA: Diagnosis present

## 2019-05-01 DIAGNOSIS — K219 Gastro-esophageal reflux disease without esophagitis: Secondary | ICD-10-CM | POA: Diagnosis present

## 2019-05-01 DIAGNOSIS — F17210 Nicotine dependence, cigarettes, uncomplicated: Secondary | ICD-10-CM | POA: Diagnosis present

## 2019-05-01 DIAGNOSIS — K760 Fatty (change of) liver, not elsewhere classified: Secondary | ICD-10-CM | POA: Diagnosis present

## 2019-05-01 DIAGNOSIS — F419 Anxiety disorder, unspecified: Secondary | ICD-10-CM | POA: Diagnosis present

## 2019-05-01 DIAGNOSIS — I10 Essential (primary) hypertension: Secondary | ICD-10-CM | POA: Diagnosis present

## 2019-05-01 DIAGNOSIS — F4310 Post-traumatic stress disorder, unspecified: Secondary | ICD-10-CM | POA: Diagnosis present

## 2019-05-01 DIAGNOSIS — F332 Major depressive disorder, recurrent severe without psychotic features: Secondary | ICD-10-CM | POA: Diagnosis present

## 2019-05-01 LAB — BASIC METABOLIC PANEL
Anion gap: 9 (ref 5–15)
BUN: 8 mg/dL (ref 6–20)
CO2: 27 mmol/L (ref 22–32)
Calcium: 8.3 mg/dL — ABNORMAL LOW (ref 8.9–10.3)
Chloride: 103 mmol/L (ref 98–111)
Creatinine, Ser: 0.67 mg/dL (ref 0.61–1.24)
GFR calc Af Amer: >60 mL/min (ref >60)
GFR calc non Af Amer: >60 mL/min (ref >60)
Glucose, Bld: 91 mg/dL (ref 70–99)
Potassium: 3.8 mmol/L (ref 3.5–5.1)
Sodium: 139 mmol/L (ref 135–145)

## 2019-05-01 LAB — CBC
HCT: 45.1 % (ref 39.0–52.0)
Hemoglobin: 14.8 g/dL (ref 13.0–17.0)
MCH: 31.4 pg (ref 26.0–34.0)
MCHC: 32.8 g/dL (ref 30.0–36.0)
MCV: 95.6 fL (ref 80.0–100.0)
Platelets: 216 10*3/uL (ref 150–400)
RBC: 4.72 MIL/uL (ref 4.22–5.81)
RDW: 14 % (ref 11.5–15.5)
WBC: 8.3 10*3/uL (ref 4.0–10.5)
nRBC: 0 % (ref 0.0–0.2)

## 2019-05-01 LAB — HIV ANTIBODY (ROUTINE TESTING W REFLEX): HIV Screen 4th Generation wRfx: NONREACTIVE

## 2019-05-01 MED ORDER — AMOXICILLIN-POT CLAVULANATE 875-125 MG PO TABS: 1.0000 | ORAL_TABLET | Freq: Two times a day (BID) | ORAL | 0 refills | Status: DC

## 2019-05-01 MED ORDER — HYDROXYZINE HCL 25 MG PO TABS
25.0000 mg | ORAL_TABLET | ORAL | Status: DC | PRN
  Administered 2019-05-03 (×2): 25 mg via ORAL
  Filled 2019-05-01 (×2): qty 1

## 2019-05-01 MED ORDER — MAGNESIUM HYDROXIDE 400 MG/5ML PO SUSP: 30.0000 mL | Freq: Every day | ORAL | Status: DC | PRN

## 2019-05-01 MED ORDER — AMOXICILLIN-POT CLAVULANATE 875-125 MG PO TABS
1.0000 | ORAL_TABLET | Freq: Two times a day (BID) | ORAL | Status: DC
  Administered 2019-05-01 – 2019-05-05 (×8): 1 via ORAL
  Filled 2019-05-01 (×10): qty 1

## 2019-05-01 MED ORDER — ACETAMINOPHEN 325 MG PO TABS
650.0000 mg | ORAL_TABLET | Freq: Four times a day (QID) | ORAL | Status: DC | PRN
  Administered 2019-05-01 – 2019-05-02 (×2): 650 mg via ORAL
  Filled 2019-05-01 (×2): qty 2

## 2019-05-01 MED ORDER — NICOTINE 21 MG/24HR TD PT24
21.0000 mg | MEDICATED_PATCH | Freq: Every day | TRANSDERMAL | Status: DC
  Administered 2019-05-01: 21 mg via TRANSDERMAL
  Filled 2019-05-01 (×3): qty 1

## 2019-05-01 MED ORDER — OXYCODONE-ACETAMINOPHEN 5-325 MG PO TABS: 1.0000 | ORAL_TABLET | Freq: Four times a day (QID) | ORAL | Status: DC | PRN

## 2019-05-01 MED ORDER — SULFAMETHOXAZOLE-TRIMETHOPRIM 400-80 MG PO TABS
1.0000 | ORAL_TABLET | Freq: Two times a day (BID) | ORAL | Status: DC
  Administered 2019-05-01 – 2019-05-05 (×8): 1 via ORAL
  Filled 2019-05-01 (×10): qty 1

## 2019-05-01 MED ORDER — DILTIAZEM HCL ER COATED BEADS 180 MG PO CP24: 180.0000 mg | ORAL_CAPSULE | Freq: Every day | ORAL | 0 refills | Status: DC

## 2019-05-01 MED ORDER — DILTIAZEM HCL ER COATED BEADS 180 MG PO CP24
180.0000 mg | ORAL_CAPSULE | Freq: Every day | ORAL | Status: DC
  Administered 2019-05-02 – 2019-05-05 (×4): 180 mg via ORAL
  Filled 2019-05-01 (×5): qty 1

## 2019-05-01 MED ORDER — ALUM & MAG HYDROXIDE-SIMETH 200-200-20 MG/5ML PO SUSP
30.0000 mL | ORAL | Status: DC | PRN
  Administered 2019-05-03 – 2019-05-05 (×2): 30 mL via ORAL
  Filled 2019-05-01 (×3): qty 30

## 2019-05-01 MED ORDER — NICOTINE 21 MG/24HR TD PT24: 21.0000 mg | MEDICATED_PATCH | Freq: Every day | TRANSDERMAL | 0 refills | Status: DC

## 2019-05-01 MED ORDER — OXYCODONE-ACETAMINOPHEN 5-325 MG PO TABS: 1.0000 | ORAL_TABLET | ORAL | 0 refills | Status: DC | PRN

## 2019-05-01 MED ORDER — SULFAMETHOXAZOLE-TRIMETHOPRIM 400-80 MG PO TABS: 1.0000 | ORAL_TABLET | Freq: Two times a day (BID) | ORAL | 0 refills | Status: DC

## 2019-05-01 MED ORDER — ZIPRASIDONE MESYLATE 20 MG IM SOLR: 20.0000 mg | INTRAMUSCULAR | Status: DC | PRN

## 2019-05-01 MED ORDER — NICOTINE 21 MG/24HR TD PT24
21.0000 mg | MEDICATED_PATCH | Freq: Every day | TRANSDERMAL | Status: DC
  Filled 2019-05-01 (×3): qty 1

## 2019-05-01 MED ORDER — LORAZEPAM 1 MG PO TABS
1.0000 mg | ORAL_TABLET | ORAL | Status: AC | PRN
  Administered 2019-05-02: 08:00:00 1 mg via ORAL
  Filled 2019-05-01: qty 1

## 2019-05-01 MED ORDER — RISPERIDONE 1 MG PO TBDP
2.0000 mg | ORAL_TABLET | Freq: Three times a day (TID) | ORAL | Status: DC | PRN
  Filled 2019-05-01: qty 2

## 2019-05-01 MED ORDER — TRAZODONE HCL 50 MG PO TABS
50.0000 mg | ORAL_TABLET | Freq: Every evening | ORAL | Status: DC | PRN
  Administered 2019-05-02 – 2019-05-03 (×2): 50 mg via ORAL
  Filled 2019-05-01 (×2): qty 1

## 2019-05-01 NOTE — Progress Notes (Signed)
   Subjective:   Patient notes improvement in redness along right index finger.  Continues to complain of moderate pain along the PIP joint.  States he is working on range of motion.  Objective: Vital signs in last 24 hours: Temp:  [98.4 F (36.9 C)-98.8 F (37.1 C)] 98.4 F (36.9 C) (05/09 0852) Pulse Rate:  [95-111] 95 (05/09 0852) Resp:  [18-19] 19 (05/09 0852) BP: (128-158)/(79-91) 142/88 (05/09 0852) SpO2:  [89 %-93 %] 93 % (05/09 0852) Weight:  [74.8 kg] 74.8 kg (05/09 0500)  Intake/Output from previous day: 05/08 0701 - 05/09 0700 In: 240 [P.O.:240] Out: -  Intake/Output this shift: No intake/output data recorded.  Recent Labs    04/29/19 2246 04/30/19 0620 05/01/19 0425  HGB 16.3 15.0 14.8   Recent Labs    04/30/19 0620 05/01/19 0425  WBC 8.2 8.3  RBC 4.83 4.72  HCT 46.0 45.1  PLT 256 216   Recent Labs    04/30/19 0620 05/01/19 0425  NA 141 139  K 3.2* 3.8  CL 103 103  CO2 26 27  BUN 5* 8  CREATININE 0.51* 0.67  GLUCOSE 123* 91  CALCIUM 8.2* 8.3*   No results for input(s): LABPT, INR in the last 72 hours.  EXAM General - Patient is Alert, Appropriate and Oriented Extremity -right index finger with laceration on the dorsal aspect of the PIP joint.  Laceration healing with good eschar tissue, allowed to heal by secondary intention.  Today there is noted improvement in swelling, erythema as well as range of motion of the index finger. No fluctuance noted.  Patient can fully extend the PIP and DIP joint.  Patient lacks 2 cm of full composite fist with the index finger.  Patient has pain with passive flexion of the digit.  Past Medical History:  Diagnosis Date  . Chronic pain disorder   . Complication of anesthesia    has woken up during IV anesthesia  . Fatty liver   . GERD (gastroesophageal reflux disease)   . Hypertension   . Panic attack    H/O  . Varicose vein of leg    right    Assessment/Plan:      Principal Problem:   Suicidal  ideations Active Problems:   Abrasion of right index finger with infection   Alcohol dependence with uncomplicated withdrawal (HCC)   Major depressive disorder, recurrent, severe without psychotic features (HCC)  Estimated body mass index is 26.6 kg/m as calculated from the following:   Height as of this encounter: 5\' 6"  (1.676 m).   Weight as of this encounter: 74.8 kg.  Continue with Bactrim and Augmentin. Encourage patient to work on gentle range of motion. We will continue to monitor, there does not appear to be mild improvement Does not appear to be flexor tenosynovitis at this time.    Lollie Marrow, PA-C [facility] 05/01/2019, 10:00 AM

## 2019-05-01 NOTE — Discharge Summary (Signed)
[facility] [place] [facility]   PATIENT NAME: Andre Wagner    MR#:  [id number]  DATE OF BIRTH:  [birth year]  DATE OF ADMISSION:  04/29/2019 ADMITTING PHYSICIAN: Hannah Beat, MD  DATE OF DISCHARGE: 05/01/2019  PRIMARY CARE PHYSICIAN: Rayetta Humphrey, MD    ADMISSION DIAGNOSIS:  Suicidal ideation [R45.851] Alcohol abuse [F10.10] Finger infection [L08.9] Alcoholic intoxication with complication (HCC) [F10.929] Depression, unspecified depression type [F32.9] Open wound of finger of right hand due to dog bite [S61.259A, W54.0XXA]  DISCHARGE DIAGNOSIS:  Principal Problem:   Suicidal ideations Active Problems:   Abrasion of right index finger with infection   Alcohol dependence with uncomplicated withdrawal (HCC)   Major depressive disorder, recurrent, severe without psychotic features (HCC)   SECONDARY DIAGNOSIS:   Past Medical History:  Diagnosis Date  . Chronic pain disorder   . Complication of anesthesia    has woken up during IV anesthesia  . Fatty liver   . GERD (gastroesophageal reflux disease)   . Hypertension   . Panic attack    H/O  . Varicose vein of leg    right    HOSPITAL COURSE:    58 year old male with history of bipolar disorder and PTSD who presented to the emergency room due to right index finger swelling and pain after having a dog bite.  1.  Right index finger cellulitis: Erythema has improved.  Patient was evaluated by orthopedic surgery.  No indication for surgery at this time.  Patient did not want IV antibiotics.  He will continue on oral Bactrim and Augmentin for 1 more week.  Orthopedic surgery will continue to monitor patient while patient is being treated for psychiatric illness. No evidence of flexor tenosynovitis at this time.   2.  Suicidal ideation with history of bipolar and PTSD: Patient evaluated by psychiatry while in the hospital.  Patient was under involuntary commitment.  Patient will need further treatment and  will be discharged to behavioral health unit.  3. Tobacco dependence: Patient is encouraged to quit smoking and willing to attempt to quit was assessed. Patient highly motivated.Counseling was provided for 4 minutes.   4.  Essential hypertension: Patient will continue on diltiazem  DISCHARGE CONDITIONS AND DIET:   Stable Regular diet  CONSULTS OBTAINED:  Treatment Team:  Kennedy Bucker, MD Mariel Craft, MD  DRUG ALLERGIES:   Allergies  Allergen Reactions  . Codeine Itching    Ok w/ Benadryl    DISCHARGE MEDICATIONS:   Allergies as of 05/01/2019      Reactions   Codeine Itching   Ok w/ Benadryl      Medication List    STOP taking these medications   amLODipine 5 MG tablet Commonly known as:  NORVASC     TAKE these medications   amoxicillin-clavulanate 875-125 MG tablet Commonly known as:  AUGMENTIN Take 1 tablet by mouth 2 (two) times daily for 7 days.   diltiazem 180 MG 24 hr capsule Commonly known as:  CARDIZEM CD Take 1 capsule (180 mg total) by mouth daily. Start taking on:  May 02, 2019   nicotine 21 mg/24hr patch Commonly known as:  NICODERM CQ - dosed in mg/24 hours Place 1 patch (21 mg total) onto the skin daily. Start taking on:  May 02, 2019   oxyCODONE-acetaminophen 5-325 MG tablet Commonly known as:  PERCOCET/ROXICET Take 1-2 tablets by mouth every 4 (four) hours as needed for moderate pain. What changed:  how much to take  sulfamethoxazole-trimethoprim 400-80 MG tablet Commonly known as:  BACTRIM Take 1 tablet by mouth every 12 (twelve) hours for 7 days.         Today   CHIEF COMPLAINT:  Finger better impproved can bend   VITAL SIGNS:  Blood pressure (!) 142/88, pulse 95, temperature 98.4 F (36.9 C), temperature source Oral, resp. rate 19, height 5\' 6"  (1.676 m), weight 74.8 kg, SpO2 93 %.   REVIEW OF SYSTEMS:  Review of Systems  Constitutional: Negative.  Negative for chills, fever and malaise/fatigue.  HENT:  Negative.  Negative for ear discharge, ear pain, hearing loss, nosebleeds and sore throat.   Eyes: Negative.  Negative for blurred vision and pain.  Respiratory: Negative.  Negative for cough, hemoptysis, shortness of breath and wheezing.   Cardiovascular: Negative.  Negative for chest pain, palpitations and leg swelling.  Gastrointestinal: Negative.  Negative for abdominal pain, blood in stool, diarrhea, nausea and vomiting.  Genitourinary: Negative.  Negative for dysuria.  Musculoskeletal: Negative.  Negative for back pain.       Finger pain better  Skin: Negative.   Neurological: Negative for dizziness, tremors, speech change, focal weakness, seizures and headaches.  Endo/Heme/Allergies: Negative.  Does not bruise/bleed easily.  Psychiatric/Behavioral: Positive for depression and substance abuse. Negative for hallucinations, memory loss and suicidal ideas. The patient is not nervous/anxious and does not have insomnia.      PHYSICAL EXAMINATION:  GENERAL:  58 y.o.-year-old patient lying in the bed with no acute distress.  NECK:  Supple, no jugular venous distention. No thyroid enlargement, no tenderness.  LUNGS: Normal breath sounds bilaterally, no wheezing, rales,rhonchi  No use of accessory muscles of respiration.  CARDIOVASCULAR: S1, S2 normal. No murmurs, rubs, or gallops.  ABDOMEN: Soft, non-tender, non-distended. Bowel sounds present. No organomegaly or mass.  EXTREMITIES: No pedal edema, cyanosis, or clubbing.  improvement in swelling, erythema as well as range of motion of the index finger. No fluctuance noted.  Patient can fully extend the PIP and DIP joint.  Patient has pain with passive flexion of the digit. PSYCHIATRIC: The patient is alert and oriented x 3.  SKIN: No obvious rash, lesion, or ulcer.   DATA REVIEW:   CBC Recent Labs  Lab 05/01/19 0425  WBC 8.3  HGB 14.8  HCT 45.1  PLT 216    Chemistries  Recent Labs  Lab 04/29/19 2246  05/01/19 0425  NA 144   <  > 139  K 3.6   < > 3.8  CL 105   < > 103  CO2 25   < > 27  GLUCOSE 122*   < > 91  BUN <5*   < > 8  CREATININE 0.56*   < > 0.67  CALCIUM 8.4*   < > 8.3*  AST 52*  --   --   ALT 25  --   --   ALKPHOS 116  --   --   BILITOT 0.6  --   --    < > = values in this interval not displayed.    Cardiac Enzymes No results for input(s): TROPONINI in the last 168 hours.  Microbiology Results  @MICRORSLT48 @  RADIOLOGY:  Dg Finger Index Right  Result Date: 04/29/2019 CLINICAL DATA:  Dog bite with swelling EXAM: RIGHT INDEX FINGER 2+V COMPARISON:  None. FINDINGS: No fracture or malalignment. Joint spaces are normal. No radiopaque foreign body. Soft tissue swelling is present IMPRESSION: Soft tissue swelling without acute osseous abnormality Electronically Signed   By:  Jasmine Pang M.D.   On: 04/29/2019 23:33      Allergies as of 05/01/2019      Reactions   Codeine Itching   Ok w/ Benadryl      Medication List    STOP taking these medications   amLODipine 5 MG tablet Commonly known as:  NORVASC     TAKE these medications   amoxicillin-clavulanate 875-125 MG tablet Commonly known as:  AUGMENTIN Take 1 tablet by mouth 2 (two) times daily for 7 days.   diltiazem 180 MG 24 hr capsule Commonly known as:  CARDIZEM CD Take 1 capsule (180 mg total) by mouth daily. Start taking on:  May 02, 2019   nicotine 21 mg/24hr patch Commonly known as:  NICODERM CQ - dosed in mg/24 hours Place 1 patch (21 mg total) onto the skin daily. Start taking on:  May 02, 2019   oxyCODONE-acetaminophen 5-325 MG tablet Commonly known as:  PERCOCET/ROXICET Take 1-2 tablets by mouth every 4 (four) hours as needed for moderate pain. What changed:  how much to take   sulfamethoxazole-trimethoprim 400-80 MG tablet Commonly known as:  BACTRIM Take 1 tablet by mouth every 12 (twelve) hours for 7 days.          Management plans discussed with the patient and he is in agreement. Stable for discharge    Patient should follow up with psych  CODE STATUS:     Code Status Orders  (From admission, onward)         Start     Ordered   04/30/19 0121  Full code  Continuous     04/30/19 0123        Code Status History    Date Active Date Inactive Code Status Order ID Comments User Context   06/02/2018 2111 06/03/2018 1428 Full Code [id number]  Arnaldo Natal, MD Inpatient   11/26/2017 1405 11/27/2017 1803 Full Code [id number]  Carolan Shiver, MD Inpatient      TOTAL TIME TAKING CARE OF THIS PATIENT: 38 minutes.    Note: This dictation was prepared with Dragon dictation along with smaller phrase technology. Any transcriptional errors that result from this process are unintentional.  Adrian Saran M.D on 05/01/2019 at 10:18 AM  Between 7am to 6pm - Pager - [phone] After 6pm go to [email or web address] - password [email or web address]  [facility]  Office  [phone]  CC: Primary care physician; Rayetta Humphrey, MD

## 2019-05-01 NOTE — BH Assessment (Signed)
Patient is to be admitted to [facility] by Dr. Viviano Simas.  Attending Physician will be Dr. Toni Amend.   Patient has been assigned to room 309, by [facility] Charge Nurse Lorenda Hatchet.   Intake Paper Work has been signed and placed on patient chart.  Staff is aware of the admission:  Darl Pikes, Patient's Nurse   Dondra Prader, Patient Access.

## 2019-05-01 NOTE — Tx Team (Signed)
Initial Treatment Plan 05/01/2019 3:21 PM [name] AUQ:[id number]    PATIENT STRESSORS: Financial difficulties Health problems Occupational concerns Substance abuse   PATIENT STRENGTHS: Average or above average intelligence Capable of independent living   PATIENT IDENTIFIED PROBLEMS: Suicidal Ideations  Alcohol Abuse  Ineffective Coping skills                 DISCHARGE CRITERIA:  Ability to meet basic life and health needs Adequate post-discharge living arrangements Improved stabilization in mood, thinking, and/or behavior  PRELIMINARY DISCHARGE PLAN: Attend aftercare/continuing care group Attend 12-step recovery group Outpatient therapy  PATIENT/FAMILY INVOLVEMENT: This treatment plan has been presented to and reviewed with the patient, Andre Wagner.  The patient and family have been given the opportunity to ask questions and make suggestions.  Berkley Harvey, RN 05/01/2019, 3:21 PM

## 2019-05-01 NOTE — TOC Transition Note (Signed)
Transition of Care [facility]) - CM/SW Discharge Note   Patient Details  Name: Andre Wagner MRN: [id number] Date of Birth: 04-16-1961  [facility] CM/SW Contact:  Judi Cong, LCSW Phone Number: 05/01/2019, 10:22 AM   Clinical Narrative:   The CSW received a consult for resource needs. The patient will discharge to the BMU today. The team in the BMU will resume care and address needs in the community. CSW is signing off. Please consult should discharge to BMU be discontinued.          Patient Goals and CMS Choice  N/A      Discharge Placement   [facility] BMU under IVC due to SI.                     Discharge Plan and Services   [facility] BMU under IVC due to SI.                                   Social Determinants of Health (SDOH) Interventions     Readmission Risk Interventions No flowsheet data found.

## 2019-05-01 NOTE — Progress Notes (Signed)
Patient ID: Andre Wagner, male   DOB: 12/12/1961, 58 y.o.   MRN: [id number]  D: Patient skin assessment completed with [employer] RN, patient has abrasion to right wrist and bite to right finger. Finger is dressed with clean gauze and tape. No contraband found. Patient is a medium fall risk. Patient denies SI currently but has endorced passive SI today.  A: Patient oriented to unit/room/call light. Patient was offered support and encouragement. Patient was encourage to attend groups, participate in unit activities and continue with plan of care. Q x 15 minute observation checks were completed for safety.   R: Patient has no complaints at this time. Patient is receptive to treatment and safety maintained on unit.

## 2019-05-01 NOTE — Plan of Care (Signed)
  Problem: Clinical Measurements: Goal: Will remain free from infection Outcome: Progressing   Problem: Coping: Goal: Level of anxiety will decrease Outcome: Progressing   Problem: Pain Managment: Goal: General experience of comfort will improve Outcome: Progressing   Problem: Skin Integrity: Goal: Risk for impaired skin integrity will decrease Outcome: Progressing   
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
## 2019-05-02 DIAGNOSIS — F332 Major depressive disorder, recurrent severe without psychotic features: Secondary | ICD-10-CM

## 2019-05-02 MED ORDER — ADULT MULTIVITAMIN W/MINERALS CH
1.0000 | ORAL_TABLET | Freq: Every day | ORAL | Status: DC
  Administered 2019-05-03 – 2019-05-05 (×3): 1 via ORAL
  Filled 2019-05-02 (×3): qty 1

## 2019-05-02 NOTE — H&P (Signed)
Psychiatric Admission Assessment Adult  Patient Identification: Andre Wagner MRN:  [id number] Date of Evaluation:  05/02/2019 Chief Complaint:  MDD Principal Diagnosis: <principal problem not specified> Diagnosis:  Active Problems:   MDD (major depressive disorder), recurrent severe, without psychosis (HCC) Alcohol use disorder  History of Present Iless:  AndreWagner is a 58yo M F with a past psychiatric history of depressive disorder, who was brough to [facility] ED via EMS on 04/29/19 for suicidal thoughts. Patient reportedly told police officer he wanted him to run him over and/or shoot him.    ED workup revealed that the patient was intoxicated with BAL 321 and his u-tox was positive for benzodiazepines. His AST was elevated.  Patient also found to have dog bites to the right index finger that appeared to be infected.  Patient was admitted to the medical floor for inpatient treatment and IV antibiotics as well as orthopedics consult.     On the medical floor, patient consulted by psychiatry. Per psych NP Thompson`s note from 04/30/19, patient expressed depressed mood and reported that he is being evicted from his home because his landlord has decided to sell the property. Patient reported hearing voices. Patient admitted that he did tell the officer he wanted him to run him over with his car and/or shoot him.  He also voiced to ED physician that he wanted the officers to shoot him.     Patient reevaluated by psychiatrist, Dr. Viviano Simas on 05/01/19. Per her note: patient reports that he had been caring for his landlord's mother who had a mobile home on the same property for the past 6 months while she was terminally ill with cancer.  He reports that the woman passed away recently, and his landlord has decided to sell his property and had given him notice that he would be evicted.  Patient describes that he became despondent and felt hopeless.  He did not know how he would live, and turned to alcohol with  thoughts of ending his life.  Patient is minimizing effects of his alcoholism as well as his suicidal thoughts at this time.  He reported he had hoped to be home for Mother's Day.  When asked if we could contact his mother for collateral, he refuses permission, stating "she will say a lot of bad stuff about me that would make me have to stay in the hospital."  Patient reports he has PTSD from his military experience.  He denies that he is followed by the [place].  Patient is denying homicidal ideation.  He currently is denying any auditory and visual hallucinations.  He is agreeable to inpatient psychiatric admission, however reports he is reluctant to want to take medication.  Denies having any withdrawal effects from alcohol at this time.  He denies any history of alcohol withdrawal seizures or DTs.   On interview in the unit today, patient reports "feeling allright, besides being here on my birthday on Mother`s day". He corroborates the above story that he became depressed due to possible eviction from his current place. He reports "I am not afraid of that now. I heard that [place] Governor said that no one can be evicted during that pandemic time". He denies feeling depressed today. Reports "I just want to go back home". He denies feeling suicidal, says "It was alcohol talking". He denies thoughts of harming others, denies any hallucinations, does not express any delusions. Reports "okay" sleep. He is still refusing to start any psych medications, says "I don`t need them. I  will be deal with all my problems myself". He admits that alcohol is a problem and is considering to start counseling after discharge. He is concerned about transportation to appointments, says "the dog, who bite me, was taken for quarantine and the dog`s owner has my car. I think he will be mad at me in this situation. It`s okay, I can find a different transport". He reports that he has supportive mother and siblings. Reports PTSD from [employer],  would not provide details. He denies having guns at home. He is asking to switch him to regular diet "Its my birthday, the food tastes like nothing here, can I at least eat some regular food here?!". He does not give permission to contact his mother for collateral "it`s Mother`s day, I don`t want to upset her".    PAST PSYCH HISTORY:  Previous psych diagnoses: depression, anxiety, alcohol use disorder. Previous psychiatric hospitalizations: denies Previous outpatient psychiatrist: denies Therapist/Counselor: none currently History of prior suicide attempts: denies Non-suicidal self-injurious behaviors: denies History of violence: denies Previous psych medication: cannot recall     MEDICAL HISTORY: HTN. Alcohol use d/o.   ALLERGIES: codeine   SOCIAL HISTORY: -Patient has no guardian. -Adverse childhood experience:denies h/o physical, emotional, sexual abuse.  -Currently lives alone. -Marital/relationships history: single -Children: no -Education: HS grad -Work/finances: unemployed -Legal history: denies current issues, being on probation, parole. -Military history: yes -Guns in possession: denies    SUBSTANCE USE: Alcohol: 5ht of vodka daily Illicit drug use: denies; remote h/o cocaine use. Smokes cigarettes.    FAMILY HISTORY:  Per patient, not-remarkable for mental illness.   Associated Signs/Symptoms: Depression Symptoms:  depressed mood, (Hypo) Manic Symptoms:  N/A Anxiety Symptoms:  Excessive Worry, Psychotic Symptoms:  None PTSD Symptoms: Had a traumatic exposure:  from military Total Time spent with patient: 30 minutes  Past Psychiatric History: see above  Is the patient at risk to self? No.  Has the patient been a risk to self in the past 6 months? No.  Has the patient been a risk to self within the distant past? No.  Is the patient a risk to others? No.  Has the patient been a risk to others in the past 6 months? No.  Has the patient been a risk to  others within the distant past? No.   Prior Inpatient Therapy:   Prior Outpatient Therapy:    Alcohol Screening: 1. How often do you have a drink containing alcohol?: 4 or more times a week 2. How many drinks containing alcohol do you have on a typical day when you are drinking?: 7, 8, or 9 3. How often do you have six or more drinks on one occasion?: Daily or almost daily AUDIT-C Score: 11 4. How often during the last year have you found that you were not able to stop drinking once you had started?: Daily or almost daily 5. How often during the last year have you failed to do what was normally expected from you becasue of drinking?: Daily or almost daily 6. How often during the last year have you needed a first drink in the morning to get yourself going after a heavy drinking session?: Daily or almost daily 7. How often during the last year have you had a feeling of guilt of remorse after drinking?: Less than monthly 8. How often during the last year have you been unable to remember what happened the night before because you had been drinking?: Less than monthly 9. Have you or someone  else been injured as a result of your drinking?: Yes, during the last year 10. Has a relative or friend or a doctor or another health worker been concerned about your drinking or suggested you cut down?: Yes, during the last year Alcohol Use Disorder Identification Test Final Score (AUDIT): 33 Alcohol Brief Interventions/Follow-up: Alcohol Education, Brief Advice Substance Abuse History in the last 12 months:  Yes.   Consequences of Substance Abuse: Medical Consequences:  psych admission Previous Psychotropic Medications: Yes  Psychological Evaluations: Yes  Past Medical History:  Past Medical History:  Diagnosis Date   Chronic pain disorder    Complication of anesthesia    has woken up during IV anesthesia   Fatty liver    GERD (gastroesophageal reflux disease)    Hypertension    Panic attack     H/O   Varicose vein of leg    right    Past Surgical History:  Procedure Laterality Date   BACK SURGERY     EVALUATION UNDER ANESTHESIA WITH HEMORRHOIDECTOMY N/A 11/26/2017   Procedure: EXAM UNDER ANESTHESIA WITH HEMORRHOIDECTOMY;  Surgeon: Carolan Shiver, MD;  Location: [facility] OR[facility];  Service: General;  Laterality: N/A;   FACIAL FRACTURE SURGERY Right 03/25/2019   Pt unable to have surgery d/t financial/transportation   FRACTURE SURGERY     HARVEST BONE GRAFT     from right hip to repair fracture of the mandible   KNEE ARTHROSCOPY Right 08/15/2017   Procedure: RIGHT KNEE ARTHROSCOPY  WITH PARTIAL MEDIAL MENISECTOMY, CHONDROPLASTY AND MICROFRACTURE OF LATERAL FEMORAL CONDYLE;  Surgeon: Erin Sons, MD;  Location: [facility];  Service: Orthopedics;  Laterality: Right;  ROB BAGWELL   MANDIBLE FRACTURE SURGERY Left 2000   metal plate in jaw   NASAL SINUS SURGERY     UMBILICAL HERNIA REPAIR N/A 10/25/2016   Procedure: HERNIA REPAIR UMBILICAL ADULT;  Surgeon: Nadeen Landau, MD;  Location: [facility];  Service: General;  Laterality: N/A;   Family History: History reviewed. No pertinent family history. Family Psychiatric  History: see above - unremarkable.  Tobacco Screening: Have you used any form of tobacco in the last 30 days? (Cigarettes, Smokeless Tobacco, Cigars, and/or Pipes): Yes Tobacco use, Select all that apply: 5 or more cigarettes per day Are you interested in Tobacco Cessation Medications?: Yes, will notify MD for an order Counseled patient on smoking cessation including recognizing danger situations, developing coping skills and basic information about quitting provided: Refused/Declined practical counseling   Social History:  Social History   Substance and Sexual Activity  Alcohol Use Yes   Comment: ~5th a day     Social History   Substance and Sexual Activity  Drug Use Yes   Types: Cocaine   Comment: states his gf smokes crack in house,  but he doesnt.     Additional Social History: See above  Allergies:   Allergies  Allergen Reactions   Codeine Itching    Ok w/ Benadryl   Lab Results:  Results for orders placed or performed during the hospital encounter of 04/29/19 (from the past 48 hour(s))  CBC     Status: None   Collection Time: 05/01/19  4:25 AM  Result Value Ref Range   WBC 8.3 4.0 - 10.5 K/uL   RBC 4.72 4.22 - 5.81 MIL/uL   Hemoglobin 14.8 13.0 - 17.0 g/dL   HCT 96.0 45.4 - 09.8 %   MCV 95.6 80.0 - 100.0 fL   MCH 31.4 26.0 - 34.0 pg   MCHC 32.8  30.0 - 36.0 g/dL   RDW 94.5 85.9 - 29.2 %   Platelets 216 150 - 400 K/uL   nRBC 0.0 0.0 - 0.2 %    Comment: Performed at [facility], [address] [place], [place] [address]  Basic metabolic panel     Status: Abnormal   Collection Time: 05/01/19  4:25 AM  Result Value Ref Range   Sodium 139 135 - 145 mmol/L   Potassium 3.8 3.5 - 5.1 mmol/L   Chloride 103 98 - 111 mmol/L   CO2 27 22 - 32 mmol/L   Glucose, Bld 91 70 - 99 mg/dL   BUN 8 6 - 20 mg/dL   Creatinine, Ser 6.38 0.61 - 1.24 mg/dL   Calcium 8.3 (L) 8.9 - 10.3 mg/dL   GFR calc non Af Amer >60 >60 mL/min   GFR calc Af Amer >60 >60 mL/min   Anion gap 9 5 - 15    Comment: Performed at [facility], [address], [place], [place] [address]    Blood Alcohol level:  Lab Results  Component Value Date   ETH 321 (HH) 04/29/2019   ETH 152 (H) 04/16/2019    Metabolic Disorder Labs:  No results found for: HGBA1C, MPG No results found for: PROLACTIN No results found for: CHOL, TRIG, HDL, CHOLHDL, VLDL, LDLCALC  Current Medications: Current Facility-Administered Medications  Medication Dose Route Frequency Provider Last Rate Last Dose   acetaminophen (TYLENOL) tablet 650 mg  650 mg Oral Q6H PRN Mariel Craft, MD   650 mg at 05/02/19 0737   alum & mag hydroxide-simeth (MAALOX/MYLANTA) 200-200-20 MG/5ML suspension 30 mL  30 mL Oral Q4H PRN Mariel Craft, MD        amoxicillin-clavulanate (AUGMENTIN) 875-125 MG per tablet 1 tablet  1 tablet Oral BID Mariel Craft, MD   1 tablet at 05/02/19 6579   diltiazem (CARDIZEM CD) 24 hr capsule 180 mg  180 mg Oral Daily Mariel Craft, MD   180 mg at 05/02/19 0383   hydrOXYzine (ATARAX/VISTARIL) tablet 25 mg  25 mg Oral Q4H PRN Mariel Craft, MD       magnesium hydroxide (MILK OF MAGNESIA) suspension 30 mL  30 mL Oral Daily PRN Mariel Craft, MD       nicotine (NICODERM CQ - dosed in mg/24 hours) patch 21 mg  21 mg Transdermal Daily Mariel Craft, MD   Stopped at 05/01/19 1512   oxyCODONE-acetaminophen (PERCOCET/ROXICET) 5-325 MG per tablet 1-2 tablet  1-2 tablet Oral Q6H PRN Mariel Craft, MD       risperiDONE (RISPERDAL M-TABS) disintegrating tablet 2 mg  2 mg Oral Q8H PRN Mariel Craft, MD       And   ziprasidone (GEODON) injection 20 mg  20 mg Intramuscular PRN Mariel Craft, MD       sulfamethoxazole-trimethoprim (BACTRIM) 400-80 MG per tablet 1 tablet  1 tablet Oral Q12H Mariel Craft, MD   1 tablet at 05/02/19 3383   traZODone (DESYREL) tablet 50 mg  50 mg Oral QHS PRN Mariel Craft, MD       PTA Medications: Medications Prior to Admission  Medication Sig Dispense Refill Last Dose   amLODipine (NORVASC) 5 MG tablet Take 5 mg by mouth daily.   Past Week at Unknown time   famotidine (PEPCID) 20 MG tablet Take 20 mg by mouth 2 (two) times a day.   prn at prn   amoxicillin-clavulanate (AUGMENTIN)  875-125 MG tablet Take 1 tablet by mouth 2 (two) times daily for 7 days. 14 tablet 0    diltiazem (CARDIZEM CD) 180 MG 24 hr capsule Take 1 capsule (180 mg total) by mouth daily. 30 capsule 0    nicotine (NICODERM CQ - DOSED IN MG/24 HOURS) 21 mg/24hr patch Place 1 patch (21 mg total) onto the skin daily. 28 patch 0    oxyCODONE-acetaminophen (PERCOCET/ROXICET) 5-325 MG tablet Take 1-2 tablets by mouth every 4 (four) hours as needed for moderate pain. 30 tablet 0     sulfamethoxazole-trimethoprim (BACTRIM) 400-80 MG tablet Take 1 tablet by mouth every 12 (twelve) hours for 7 days. 14 tablet 0     Musculoskeletal: Strength & Muscle Tone: within normal limits Gait & Station: normal Patient leans: N/A  Psychiatric Specialty Exam: Physical Exam  Constitutional: He appears well-developed and well-nourished.  HENT:  Head: Normocephalic and atraumatic.  Eyes: Pupils are equal, round, and reactive to light. Conjunctivae and EOM are normal.  Neck: Normal range of motion.  Cardiovascular: Normal rate and regular rhythm.  Respiratory: Effort normal and breath sounds normal.  GI: Soft. Bowel sounds are normal.  Musculoskeletal:       Arms:  Skin: Skin is warm and dry.    Review of Systems  Constitutional: Negative.   HENT: Negative.   Eyes: Negative.   Respiratory: Negative.   Cardiovascular: Negative.   Gastrointestinal: Negative.   Genitourinary: Negative.   Musculoskeletal: Negative.   Skin: Negative.   Neurological: Negative.   Psychiatric/Behavioral: Positive for depression and substance abuse. Negative for hallucinations, memory loss and suicidal ideas. The patient is not nervous/anxious and does not have insomnia.     Blood pressure 135/81, pulse 86, temperature 98.5 F (36.9 C), temperature source Oral, resp. rate 18, height 5' 6.14" (1.68 m), weight 74.4 kg, SpO2 98 %.Body mass index is 26.36 kg/m.  General Appearance: Casual and Disheveled  Eye Contact:  Good  Speech:  Normal Rate  Volume:  Normal  Mood:  Euthymic  Affect:  Constricted  Thought Process:  Coherent, Goal Directed and Linear  Orientation:  Full (Time, Place, and Person)  Thought Content:  Logical  Suicidal Thoughts:  No  Homicidal Thoughts:  No  Memory:  Immediate;   Fair Recent;   Fair Remote;   Fair  Judgement:  Other:  limited - minimizes substance use and need for treatment  Insight:  Shallow  Psychomotor Activity:  Normal  Concentration:  Concentration:  Fair and Attention Span: Fair  Recall:  [employer]  Fund of Knowledge:  Fair  Language:  Fair  Akathisia:  No  Handed:  Right  AIMS (if indicated):     Assets:  Social Support  ADL's:  Intact  Cognition:  WNL  Sleep:       Treatment Plan Summary: Daily contact with patient to assess and evaluate symptoms and progress in treatment  Observation Level/Precautions:  15 minute checks  Laboratory:  N/A  Psychotherapy:    Medications:    Consultations:    Discharge Concerns:    Estimated LOS:  Other:     Physician Treatment Plan for Primary Diagnosis: <principal problem not specified> Long Term Goal(s): Improvement in symptoms so as ready for discharge  Short Term Goals: Ability to identify changes in lifestyle to reduce recurrence of condition will improve, Ability to verbalize feelings will improve, Ability to disclose and discuss suicidal ideas, Ability to demonstrate self-control will improve, Ability to identify and develop effective coping behaviors will  improve, Ability to maintain clinical measurements within normal limits will improve, Compliance with prescribed medications will improve and Ability to identify triggers associated with substance abuse/mental health issues will improve  Physician Treatment Plan for Secondary Diagnosis: Active Problems:   MDD (major depressive disorder), recurrent severe, without psychosis (HCC)  Long Term Goal(s): Improvement in symptoms so as ready for discharge  Short Term Goals: Ability to identify changes in lifestyle to reduce recurrence of condition will improve, Ability to verbalize feelings will improve, Ability to disclose and discuss suicidal ideas, Ability to demonstrate self-control will improve, Ability to identify and develop effective coping behaviors will improve, Ability to maintain clinical measurements within normal limits will improve, Compliance with prescribed medications will improve and Ability to identify triggers associated with  substance abuse/mental health issues will improve  I certify that inpatient services furnished can reasonably be expected to improve the patient's condition.     ASSESSMENT: AndreMcnellis is a 58yo M F with a past psychiatric history of depressive disorder, who was brough to [facility] ED via EMS on 04/29/19 for suicidal thoughts in settings of acute alcohol intoxication. On psych consult while sober, patient expressed depressed mood in settings of current psycho-social stressors and substance use. Patient admitted to inpatient psych unit for safety and stabilization. On interview in the unit today, patient admits to life stressors, denies feeling severely depressed, denies feeling suicidal, homicidal, denies symptoms of psychosis, mania. He blames his alcohol abuse for recent presentation in ED, but appears to minimize both his substance use and symptoms of depression. He refuses to start any psychotropic medications, although is agreeable to chemical-dependency counseling. No unsafe behavior from patient observed in the unit.   IMPRESSION: Major depressive disorder, moderate, recurrent, without psychotic features. Alcohol use disorder. Nicotine dependence.  Plan: -continue inpatient hospital admission; 15-minute checks; daily contact with patient to assess and evaluate symptoms and progress in treatment; psychoeducation; encourage participation in milieu activities. -no psychotropic medications scheduled due to patient`s refusal. -patient requires SW consult re outpatient MH and chem-dep referral. -continue Bactrim and Augmentin for wound infection; -continue Cardizem for HTN. -Disposition: to be determined. SW consult required.   Thalia Party, MD 5/10/20209:21 AM

## 2019-05-02 NOTE — [facility] Suicide Risk Assessment (Addendum)
[facility] Admission Suicide Risk Assessment   Nursing information obtained from:  Patient Demographic factors:  Male  Current Mental Status:  Denies suicidal ideations Loss Factors:  Decrease in vocational status, Decline in physical health, Financial problems / change in socioeconomic status Historical Factors:  NA Risk Reduction Factors:  NA  Total Time spent with patient: 30 minutes Principal Problem: <principal problem not specified> Diagnosis:  Active Problems:   MDD (major depressive disorder), recurrent severe, without psychosis (HCC)  Subjective Data:  Mr.Balon is a 58yo M F with a past psychiatric history of depressive disorder, who was brough to [facility] ED via EMS on 04/29/19 for suicidal thoughts in settings of acute alcohol intoxication. On psych consult while sober, patient expressed depressed mood in settings of current psycho-social stressors and substance use. Patient admitted to inpatient psych unit for safety and stabilization.   Continued Clinical Symptoms:  Alcohol Use Disorder Identification Test Final Score (AUDIT): 33 The "Alcohol Use Disorders Identification Test", Guidelines for Use in Primary Care, Second Edition.  World Science writer [facility]). Score between 0-7:  no or low risk or alcohol related problems. Score between 8-15:  moderate risk of alcohol related problems. Score between 16-19:  high risk of alcohol related problems. Score 20 or above:  warrants further diagnostic evaluation for alcohol dependence and treatment.   CLINICAL FACTORS:   Depression Alcohol abuse   Psychiatric Specialty Exam: see H&P note from today.   COGNITIVE FEATURES THAT CONTRIBUTE TO RISK:  None    SUICIDE RISK:  Moderate/low acute risk, elevated chronic risk.  Suicide Risk Assessment: Caucasian male patient, presence of mental disorder (depression), alcohol/substance use, living alone, unwillingness to seek help for depression are dynamic risk factors. The patient denies  suicidal thoughts, denies a history of suicidal attempts, denies access to firearm. Patient is future-oriented, has family support, interested in substance-abuse treatment - protective factors. Therefore, represents a low risk for harming self acutely and elevated chronic risk due to non-modifiable risk factors.    PLAN OF CARE:  -continue inpatient hospital admission; 15-minute checks; daily contact with patient to assess and evaluate symptoms and progress in treatment; psychoeducation; encourage participation in milieu activities.. -patient requires SW consult re outpatient MH and chem-dep referral.   I certify that inpatient services furnished can reasonably be expected to improve the patient's condition.   Thalia Party, MD 05/02/2019, 10:08 AM

## 2019-05-02 NOTE — Progress Notes (Signed)
D: Pt isolating in room, but is pleasant with interactions. Denies SI, HI, AVH. Affect is flat, but brightens a little with interaction. Pt is pleasant and calm during interactions. Pt denies needs or concerns. A: Medications given and 15 minute safety checks done per MD order R: Pt safe and encouraged to come to staff with any concerns or needs. Will continue to monitor.

## 2019-05-02 NOTE — [facility] Counselor (Signed)
Adult Comprehensive Assessment  Patient ID: Andre Wagner, male   DOB: 08/04/1961, 58 y.o.   MRN: [id number]  Information Source: Information source: Patient  Current Stressors:  Patient states their primary concerns and needs for treatment are:: "I was drinking and said things I shouldn't say" Patient states their goals for this hospitilization and ongoing recovery are:: "some kind of treatment for alcohol" Educational / Learning stressors: none reported Employment / Job issues: none reported Family Relationships: good Surveyor, quantity / Lack of resources (include bankruptcy): no income Housing / Lack of housing: stable  Physical health (include injuries & life threatening diseases): "many" Social relationships: "I don't have many friends" Substance abuse: ETOH  Living/Environment/Situation:  Living Arrangements: Alone How long has patient lived in current situation?: 6 YEARS What is atmosphere in current home: Comfortable  Family History:  Marital status: Single Are you sexually active?: No What is your sexual orientation?: hetersosexual Has your sexual activity been affected by drugs, alcohol, medication, or emotional stress?: no Does patient have children?: No  Childhood History:  By whom was/is the patient raised?: Both parents Description of patient's relationship with caregiver when they were a child: great Patient's description of current relationship with people who raised him/her: good with mom How were you disciplined when you got in trouble as a child/adolescent?: physically Does patient have siblings?: Yes Number of Siblings: 2 Description of patient's current relationship with siblings: good Did patient suffer any verbal/emotional/physical/sexual abuse as a child?: No Did patient suffer from severe childhood neglect?: No Has patient ever been sexually abused/assaulted/raped as an adolescent or adult?: No Was the patient ever a victim of a crime or a disaster?:  No Witnessed domestic violence?: No Has patient been effected by domestic violence as an adult?: No  Education:  Highest grade of school patient has completed: GED in the [employer] Currently a student?: No Learning disability?: No  Employment/Work Situation:   Employment situation: Unemployed Patient's job has been impacted by current illness: No What is the longest time patient has a held a job?: 15 YEARS Where was the patient employed at that time?: [employer] Did You Receive Any Psychiatric Treatment/Services While in Equities trader?: No Are There Guns or Other Weapons in Your Home?: No  Financial Resources:   Financial resources: No income Does patient have a Lawyer or guardian?: No  Alcohol/Substance Abuse:   What has been your use of drugs/alcohol within the last 12 months?: ETOH- daily-vodka-1 pint If attempted suicide, did drugs/alcohol play a role in this?: No Alcohol/Substance Abuse Treatment Hx: Denies past history, Past Tx, Inpatient, Attends AA/NA Has alcohol/substance abuse ever caused legal problems?: No  Social Support System:   Conservation officer, nature Support System: Fair Museum/gallery exhibitions officer System: family Type of faith/religion: Environmental consultant:   Leisure and Hobbies: drinking  Strengths/Needs:   What is the patient's perception of their strengths?: great with numbers, computer programs, fairly liked everywhere I go Patient states these barriers may affect/interfere with their treatment: none reported Patient states these barriers may affect their return to the community: none reported  Discharge Plan:   Currently receiving community mental health services: No Patient states concerns and preferences for aftercare planning are: TBD with CSW - pt reports he would like to attend outpatient treatment for alcohol by his house Patient states they will know when they are safe and ready for discharge when: "I am ready to go now" Does  patient have access to transportation?: Yes Does patient have financial barriers related  to discharge medications?: No Will patient be returning to same living situation after discharge?: Yes  Summary/Recommendations:  Patient is a 58 year old male admitted involuntarily and diagnosed with MDD (major depressive disorder), recurrent severe, without psychosis (HCC) Alcohol use disorder.  Patient with a past psychiatric history of depressive disorder, who [facility] EMS on 04/29/19 for suicidal thoughts. Patient reportedly told police officer he wanted him to run him over and/or shoot him. Patient will benefit from crisis stabilization, medication evaluation, group therapy and psychoeducation. In addition to case management for discharge planning. At discharge it is recommended that patient adhere to the established discharge plan and continue treatment.     Avigayil Ton  CUEBAS-COLON. 05/02/2019

## 2019-05-02 NOTE — Progress Notes (Signed)
D: Patient has been isolative to room and self. Denies SI, HI and AV hallucinations. Has remained in bed. Mood is sad. Affect is congruent. Voices no complaints. A:Continue to monitor for safety. R: Safety maintained.

## 2019-05-02 NOTE — Plan of Care (Signed)
  Problem: Self-Concept: Goal: Ability to disclose and discuss suicidal ideas will improve Outcome: Progressing Goal: Will verbalize positive feelings about self Outcome: Progressing   D: Patient has been isolative to room and self. Denies SI, HI and AV hallucinations. Has remained in bed. Mood is sad. Affect is congruent. Voices no complaints. A:Continue to monitor for safety. R: Safety maintained.

## 2019-05-02 NOTE — Progress Notes (Signed)
NUTRITION ASSESSMENT  Pt identified as at risk on the Malnutrition Screen Tool  INTERVENTION: Agree with liberalization of diet to regular.  Recommend daily MVI.  NUTRITION DIAGNOSIS: Unintentional weight loss related to sub-optimal intake as evidenced by pt report, Malnutrition Screening Tool.  Goal: Pt to meet >/= 90% of their estimated nutrition needs.  Monitor:  PO intake  Assessment:  58 y.o. male with PMHx of HTN, GERD, chronic pain disorder, depressive disorder who is now admitted after having suicidal thoughts.  No meal completion has been documented yet in [facility], but when on medical floor patient was eating well and consuming 80% of meals. Noted diet was liberalized to regular today. Patient would also benefit from daily MVI. No further nutrition needs identified at this time.   Medications reviewed and include: Augmentin, nicotine patch.  Labs reviewed.  Height: Ht Readings from Last 1 Encounters:  05/01/19 5' 6.14" (1.68 m)    Weight: Wt Readings from Last 1 Encounters:  05/01/19 74.4 kg    Weight Hx: Wt Readings from Last 10 Encounters:  05/01/19 74.4 kg  05/01/19 74.8 kg  04/16/19 77.1 kg  08/01/18 79.4 kg  07/20/18 78 kg  06/03/18 77.4 kg  11/26/17 77.1 kg  11/10/17 77.1 kg  11/06/17 77.1 kg  11/02/17 77.1 kg    BMI:  Body mass index is 26.36 kg/m. Pt meets criteria for overweight based on current BMI.  Estimated Nutritional Needs: Kcal: 25-30 kcal/kg Protein: > 1 gram protein/kg Fluid: 1 ml/kcal  Diet Order:  Diet Order            Diet regular Room service appropriate? Yes; Fluid consistency: Thin  Diet effective now             Pt is also offered choice of unit snacks mid-morning and mid-afternoon.   Helane Rima, MS, RD, LDN Office: [phone] Pager: [phone] After Hours/Weekend Pager: [phone]

## 2019-05-02 NOTE — [facility] Suicide Risk Assessment (Signed)
[facility] INPATIENT:  Family/Significant Other Suicide Prevention Education  Suicide Prevention Education:  Patient Refusal for Family/Significant Other Suicide Prevention Education: The patient Andre Wagner has refused to provide written consent for family/significant other to be provided Family/Significant Other Suicide Prevention Education during admission and/or prior to discharge.  Physician notified.  Mansoor Hillyard  CUEBAS-COLON 05/02/2019, 4:12 PM

## 2019-05-02 NOTE — [facility] Group Notes (Signed)
LCSW Group Therapy Note 05/02/2019 1:15pm  Type of Therapy and Topic: Group Therapy: Feelings Around Returning Home & Establishing a Supportive Framework and Supporting Oneself When Supports Not Available  Participation Level: Did Not Attend  Description of Group:  Patients first processed thoughts and feelings about upcoming discharge. These included fears of upcoming changes, lack of change, new living environments, judgements and expectations from others and overall stigma of mental health issues. The group then discussed the definition of a supportive framework, what that looks and feels like, and how do to discern it from an unhealthy non-supportive network. The group identified different types of supports as well as what to do when your family/friends are less than helpful or unavailable  Therapeutic Goals  1. Patient will identify one healthy supportive network that they can use at discharge. 2. Patient will identify one factor of a supportive framework and how to tell it from an unhealthy network. 3. Patient able to identify one coping skill to use when they do not have positive supports from others. 4. Patient will demonstrate ability to communicate their needs through discussion and/or role plays.  Summary of Patient Progress:  Pt was invited to attend group but chose not to attend. CSW will continue to encourage pt to attend group throughout their admission.   Therapeutic Modalities Cognitive Behavioral Therapy Motivational Interviewing   Leesha Veno  CUEBAS-COLON, LCSW 05/02/2019 12:55 PM

## 2019-05-02 NOTE — Plan of Care (Signed)
  Problem: Education: Goal: Knowledge of [facility] Education information/materials will improve Outcome: Progressing Goal: Emotional status will improve Outcome: Progressing Goal: Mental status will improve Outcome: Progressing Goal: Verbalization of understanding the information provided will improve Outcome: Progressing   Problem: Education: Goal: Understanding of discharge needs will improve Outcome: Progressing   Problem: Self-Concept: Goal: Ability to disclose and discuss suicidal ideas will improve Outcome: Progressing Goal: Will verbalize positive feelings about self Outcome: Progressing   

## 2019-05-03 DIAGNOSIS — F332 Major depressive disorder, recurrent severe without psychotic features: Principal | ICD-10-CM

## 2019-05-03 MED ORDER — VENLAFAXINE HCL ER 75 MG PO CP24
75.0000 mg | ORAL_CAPSULE | Freq: Every day | ORAL | Status: DC
  Administered 2019-05-04: 08:00:00 75 mg via ORAL
  Filled 2019-05-03: qty 1

## 2019-05-03 NOTE — Progress Notes (Signed)
[facility] MD Progress Note  05/03/2019 3:46 PM Andre Wagner  MRN:  [id number] Subjective: Patient seen chart reviewed.  Patient with a history of alcohol abuse and depression came into the hospital after making suicidal statements at home and while intoxicated.  Today the patient is pleasant and interactive.  Acknowledges that his drinking is out of control.  Tends to blame it on his pain but also knows that it is causing him a lot of harm.  He is denying acute suicidal intent now.  Denies any hallucinations does not appear to be having delirium tremens symptoms.  Has a history of chronic depression and anxiety and describes himself as having "panic attacks" frequently" Principal Problem: MDD (major depressive disorder), recurrent severe, without psychosis (HCC) Diagnosis: Principal Problem:   MDD (major depressive disorder), recurrent severe, without psychosis (HCC) Active Problems:   Essential hypertension   Alcohol dependence with uncomplicated withdrawal (HCC)  Total Time spent with patient: 45 minutes  Past Psychiatric History: Patient has a history of alcohol abuse.  Has been able to stay sober at times in the past.  He is willing to participate in treatment with [facility].  Past Medical History:  Past Medical History:  Diagnosis Date  . Chronic pain disorder   . Complication of anesthesia    has woken up during IV anesthesia  . Fatty liver   . GERD (gastroesophageal reflux disease)   . Hypertension   . Panic attack    H/O  . Varicose vein of leg    right    Past Surgical History:  Procedure Laterality Date  . BACK SURGERY    . EVALUATION UNDER ANESTHESIA WITH HEMORRHOIDECTOMY N/A 11/26/2017   Procedure: EXAM UNDER ANESTHESIA WITH HEMORRHOIDECTOMY;  Surgeon: Carolan Shiver, MD;  Location: [facility] ORS;  Service: General;  Laterality: N/A;  . FACIAL FRACTURE SURGERY Right 03/25/2019   Pt unable to have surgery d/t financial/transportation  . FRACTURE SURGERY    . HARVEST BONE GRAFT      from right hip to repair fracture of the mandible  . KNEE ARTHROSCOPY Right 08/15/2017   Procedure: RIGHT KNEE ARTHROSCOPY  WITH PARTIAL MEDIAL MENISECTOMY, CHONDROPLASTY AND MICROFRACTURE OF LATERAL FEMORAL CONDYLE;  Surgeon: Erin Sons, MD;  Location: [facility];  Service: Orthopedics;  Laterality: Right;  R[facility] BAGWELL  . MANDIBLE FRACTURE SURGERY Left 2000   metal plate in jaw  . NASAL SINUS SURGERY    . UMBILICAL HERNIA REPAIR N/A 10/25/2016   Procedure: HERNIA REPAIR UMBILICAL ADULT;  Surgeon: Nadeen Landau, MD;  Location: [facility] ORS;  Service: General;  Laterality: N/A;   Family History: History reviewed. No pertinent family history. Family Psychiatric  History: None reported Social History:  Social History   Substance and Sexual Activity  Alcohol Use Yes   Comment: ~5th a day     Social History   Substance and Sexual Activity  Drug Use Yes  . Types: Cocaine   Comment: states his gf smokes crack in house, but he doesnt.     Social History   Socioeconomic History  . Marital status: Single    Spouse name: Not on file  . Number of children: Not on file  . Years of education: Not on file  . Highest education level: Not on file  Occupational History  . Occupation: unemployed  Social Needs  . Financial resource strain: Very hard  . Food insecurity:    Worry: Often true    Inability: Often true  . Transportation needs:  Medical: Yes    Non-medical: Yes  Tobacco Use  . Smoking status: Current Every Day Smoker    Packs/day: 1.00    Years: 25.00    Pack years: 25.00    Types: Cigarettes  . Smokeless tobacco: Never Used  Substance and Sexual Activity  . Alcohol use: Yes    Comment: ~5th a day  . Drug use: Yes    Types: Cocaine    Comment: states his gf smokes crack in house, but he doesnt.   . Sexual activity: Not Currently  Lifestyle  . Physical activity:    Days per week: Not on file    Minutes per session: Not on file  . Stress: Very  much  Relationships  . Social connections:    Talks on phone: Not on file    Gets together: Not on file    Attends religious service: Not on file    Active member of club or organization: Not on file    Attends meetings of clubs or organizations: Not on file    Relationship status: Not on file  Other Topics Concern  . Not on file  Social History Narrative   Pt about to be evicted & unemployed   Additional Social History:                         Sleep: Fair  Appetite:  Fair  Current Medications: Current Facility-Administered Medications  Medication Dose Route Frequency Provider Last Rate Last Dose  . acetaminophen (TYLENOL) tablet 650 mg  650 mg Oral Q6H PRN Mariel Craft, MD   650 mg at 05/02/19 0737  . alum & mag hydroxide-simeth (MAALOX/MYLANTA) 200-200-20 MG/5ML suspension 30 mL  30 mL Oral Q4H PRN Mariel Craft, MD      . amoxicillin-clavulanate (AUGMENTIN) 875-125 MG per tablet 1 tablet  1 tablet Oral BID Mariel Craft, MD   1 tablet at 05/03/19 [phone]  . diltiazem (CARDIZEM CD) 24 hr capsule 180 mg  180 mg Oral Daily Mariel Craft, MD   180 mg at 05/03/19 9476  . hydrOXYzine (ATARAX/VISTARIL) tablet 25 mg  25 mg Oral Q4H PRN Mariel Craft, MD   25 mg at 05/03/19 5465  . magnesium hydroxide (MILK OF MAGNESIA) suspension 30 mL  30 mL Oral Daily PRN Mariel Craft, MD      . multivitamin with minerals tablet 1 tablet  1 tablet Oral Daily Thalia Party, MD   1 tablet at 05/03/19 0807  . nicotine (NICODERM CQ - dosed in mg/24 hours) patch 21 mg  21 mg Transdermal Daily Mariel Craft, MD   Stopped at 05/01/19 1512  . oxyCODONE-acetaminophen (PERCOCET/ROXICET) 5-325 MG per tablet 1-2 tablet  1-2 tablet Oral Q6H PRN Mariel Craft, MD      . risperiDONE (RISPERDAL M-TABS) disintegrating tablet 2 mg  2 mg Oral Q8H PRN Mariel Craft, MD       And  . ziprasidone (GEODON) injection 20 mg  20 mg Intramuscular PRN Mariel Craft, MD      .  sulfamethoxazole-trimethoprim (BACTRIM) 400-80 MG per tablet 1 tablet  1 tablet Oral Q12H Mariel Craft, MD   1 tablet at 05/03/19 [phone]  . traZODone (DESYREL) tablet 50 mg  50 mg Oral QHS PRN Mariel Craft, MD   50 mg at 05/02/19 2252  . [START ON 05/04/2019] venlafaxine XR (EFFEXOR-XR) 24 hr capsule 75 mg  75 mg Oral Q  breakfast , Jackquline DenmarkJohn T, MD        Lab Results: No results found for this or any previous visit (from the past 48 hour(s)).  Blood Alcohol level:  Lab Results  Component Value Date   ETH 321 (HH) 04/29/2019   ETH 152 (H) 04/16/2019    Metabolic Disorder Labs: No results found for: HGBA1C, MPG No results found for: PROLACTIN No results found for: CHOL, TRIG, HDL, CHOLHDL, VLDL, LDLCALC  Physical Findings: AIMS:  , ,  ,  ,    CIWA:  CIWA-Ar Total: 1 COWS:     Musculoskeletal: Strength & Muscle Tone: within normal limits Gait & Station: normal Patient leans: N/A  Psychiatric Specialty Exam: Physical Exam  Nursing note and vitals reviewed. Constitutional: He appears well-developed and well-nourished.  HENT:  Head: Normocephalic and atraumatic.  Eyes: Pupils are equal, round, and reactive to light. Conjunctivae are normal.  Neck: Normal range of motion.  Cardiovascular: Regular rhythm and normal heart sounds.  Respiratory: Effort normal.  GI: Soft.  Musculoskeletal: Normal range of motion.  Neurological: He is alert.  Skin: Skin is warm and dry.  Psychiatric: His mood appears anxious. His affect is blunt. His speech is delayed. He is slowed. Thought content is not paranoid. Cognition and memory are impaired. He expresses impulsivity. He expresses no homicidal and no suicidal ideation.    Review of Systems  Constitutional: Negative.   HENT: Negative.   Eyes: Negative.   Respiratory: Negative.   Cardiovascular: Negative.   Gastrointestinal: Negative.   Musculoskeletal: Negative.   Skin: Negative.   Neurological: Negative.    Psychiatric/Behavioral: Positive for depression and substance abuse. Negative for hallucinations and suicidal ideas. The patient is nervous/anxious and has insomnia.     Blood pressure 132/90, pulse (!) 103, temperature 98.1 F (36.7 C), temperature source Oral, resp. rate 18, height 5' 6.14" (1.68 m), weight 74.4 kg, SpO2 97 %.Body mass index is 26.36 kg/m.  General Appearance: Casual  Eye Contact:  Fair  Speech:  Slow  Volume:  Decreased  Mood:  Dysphoric  Affect:  Congruent  Thought Process:  Coherent  Orientation:  Full (Time, Place, and Person)  Thought Content:  Logical  Suicidal Thoughts:  No  Homicidal Thoughts:  No  Memory:  Immediate;   Fair Recent;   Fair Remote;   Fair  Judgement:  Fair  Insight:  Fair  Psychomotor Activity:  Decreased  Concentration:  Concentration: Fair  Recall:  [employer]Fair  Fund of Knowledge:  Fair  Language:  Fair  Akathisia:  No  Handed:  Right  AIMS (if indicated):     Assets:  Desire for Improvement Housing  ADL's:  Impaired  Cognition:  Impaired,  Mild  Sleep:  Number of Hours: 8     Treatment Plan Summary: Daily contact with patient to assess and evaluate symptoms and progress in treatment, Medication management and Plan Patient appears to be detoxing without any difficulty.  Denies any current suicidal intent or plan.  He is open to restarting medication.  As he remembers that antidepressants have not been helpful in the past but looking back on his old records from 2015 there is a pretty clear record that Effexor seemed to be helpful at the time.  I suggested to them that we restart Effexor with a low dose of only 75 mg because he is worried about side effects.  He agrees to plan.  Possible discharge in 1 to 2 days.  Mordecai RasmussenJohn , MD 05/03/2019, 3:46 PM

## 2019-05-03 NOTE — Tx Team (Addendum)
Interdisciplinary Treatment and Diagnostic Plan Update  05/03/2019 Time of Session: 2:30pm Andre Wagner A Brandvold MRN: [id number]  Principal Diagnosis: <principal problem not specified>  Secondary Diagnoses: Active Problems:   MDD (major depressive disorder), recurrent severe, without psychosis (HCC)   Current Medications:  Current Facility-Administered Medications  Medication Dose Route Frequency Provider Last Rate Last Dose  . acetaminophen (TYLENOL) tablet 650 mg  650 mg Oral Q6H PRN Mariel CraftMaurer, Sheila M, MD   650 mg at 05/02/19 0737  . alum & mag hydroxide-simeth (MAALOX/MYLANTA) 200-200-20 MG/5ML suspension 30 mL  30 mL Oral Q4H PRN Mariel CraftMaurer, Sheila M, MD      . amoxicillin-clavulanate (AUGMENTIN) 875-125 MG per tablet 1 tablet  1 tablet Oral BID Mariel CraftMaurer, Sheila M, MD   1 tablet at 05/03/19 [phone]  . diltiazem (CARDIZEM CD) 24 hr capsule 180 mg  180 mg Oral Daily Mariel CraftMaurer, Sheila M, MD   180 mg at 05/03/19 [id number]  . hydrOXYzine (ATARAX/VISTARIL) tablet 25 mg  25 mg Oral Q4H PRN Mariel CraftMaurer, Sheila M, MD   25 mg at 05/03/19 [id number]  . magnesium hydroxide (MILK OF MAGNESIA) suspension 30 mL  30 mL Oral Daily PRN Mariel CraftMaurer, Sheila M, MD      . multivitamin with minerals tablet 1 tablet  1 tablet Oral Daily [name], [name], MD   1 tablet at 05/03/19 0807  . nicotine (NICODERM CQ - dosed in mg/24 hours) patch 21 mg  21 mg Transdermal Daily Mariel CraftMaurer, Sheila M, MD   Stopped at 05/01/19 1512  . oxyCODONE-acetaminophen (PERCOCET/ROXICET) 5-325 MG per tablet 1-2 tablet  1-2 tablet Oral Q6H PRN Mariel CraftMaurer, Sheila M, MD      . risperiDONE (RISPERDAL M-TABS) disintegrating tablet 2 mg  2 mg Oral Q8H PRN Mariel CraftMaurer, Sheila M, MD       And  . ziprasidone (GEODON) injection 20 mg  20 mg Intramuscular PRN Mariel CraftMaurer, Sheila M, MD      . sulfamethoxazole-trimethoprim (BACTRIM) 400-80 MG per tablet 1 tablet  1 tablet Oral Q12H Mariel CraftMaurer, Sheila M, MD   1 tablet at 05/03/19 [phone]  . traZODone (DESYREL) tablet 50 mg  50 mg Oral QHS PRN Mariel CraftMaurer, Sheila M, MD    50 mg at 05/02/19 2252   PTA Medications: Medications Prior to Admission  Medication Sig Dispense Refill Last Dose  . amLODipine (NORVASC) 5 MG tablet Take 5 mg by mouth daily.   Past Week at Unknown time  . famotidine (PEPCID) 20 MG tablet Take 20 mg by mouth 2 (two) times a day.   prn at prn  . amoxicillin-clavulanate (AUGMENTIN) 875-125 MG tablet Take 1 tablet by mouth 2 (two) times daily for 7 days. 14 tablet 0   . diltiazem (CARDIZEM CD) 180 MG 24 hr capsule Take 1 capsule (180 mg total) by mouth daily. 30 capsule 0   . nicotine (NICODERM CQ - DOSED IN MG/24 HOURS) 21 mg/24hr patch Place 1 patch (21 mg total) onto the skin daily. 28 patch 0   . oxyCODONE-acetaminophen (PERCOCET/ROXICET) 5-325 MG tablet Take 1-2 tablets by mouth every 4 (four) hours as needed for moderate pain. 30 tablet 0   . sulfamethoxazole-trimethoprim (BACTRIM) 400-80 MG tablet Take 1 tablet by mouth every 12 (twelve) hours for 7 days. 14 tablet 0     Patient Stressors: Financial difficulties Health problems Occupational concerns Substance abuse  Patient Strengths: Average or above average intelligence Capable of independent living  Treatment Modalities: Medication Management, Group therapy, Case management,  1 to 1 session with clinician, Psychoeducation,  Recreational therapy.   Physician Treatment Plan for Primary Diagnosis: <principal problem not specified> Long Term Goal(s): Improvement in symptoms so as ready for discharge Improvement in symptoms so as ready for discharge   Short Term Goals: Ability to identify changes in lifestyle to reduce recurrence of condition will improve Ability to verbalize feelings will improve Ability to disclose and discuss suicidal ideas Ability to demonstrate self-control will improve Ability to identify and develop effective coping behaviors will improve Ability to maintain clinical measurements within normal limits will improve Compliance with prescribed medications  will improve Ability to identify triggers associated with substance abuse/mental health issues will improve Ability to identify changes in lifestyle to reduce recurrence of condition will improve Ability to verbalize feelings will improve Ability to disclose and discuss suicidal ideas Ability to demonstrate self-control will improve Ability to identify and develop effective coping behaviors will improve Ability to maintain clinical measurements within normal limits will improve Compliance with prescribed medications will improve Ability to identify triggers associated with substance abuse/mental health issues will improve  Medication Management: Evaluate patient's response, side effects, and tolerance of medication regimen.  Therapeutic Interventions: 1 to 1 sessions, Unit Group sessions and Medication administration.  Evaluation of Outcomes: Progressing  Physician Treatment Plan for Secondary Diagnosis: Active Problems:   MDD (major depressive disorder), recurrent severe, without psychosis (HCC)  Long Term Goal(s): Improvement in symptoms so as ready for discharge Improvement in symptoms so as ready for discharge   Short Term Goals: Ability to identify changes in lifestyle to reduce recurrence of condition will improve Ability to verbalize feelings will improve Ability to disclose and discuss suicidal ideas Ability to demonstrate self-control will improve Ability to identify and develop effective coping behaviors will improve Ability to maintain clinical measurements within normal limits will improve Compliance with prescribed medications will improve Ability to identify triggers associated with substance abuse/mental health issues will improve Ability to identify changes in lifestyle to reduce recurrence of condition will improve Ability to verbalize feelings will improve Ability to disclose and discuss suicidal ideas Ability to demonstrate self-control will improve Ability to  identify and develop effective coping behaviors will improve Ability to maintain clinical measurements within normal limits will improve Compliance with prescribed medications will improve Ability to identify triggers associated with substance abuse/mental health issues will improve     Medication Management: Evaluate patient's response, side effects, and tolerance of medication regimen.  Therapeutic Interventions: 1 to 1 sessions, Unit Group sessions and Medication administration.  Evaluation of Outcomes: Progressing   RN Treatment Plan for Primary Diagnosis: <principal problem not specified> Long Term Goal(s): Knowledge of disease and therapeutic regimen to maintain health will improve  Short Term Goals: Ability to demonstrate self-control, Ability to participate in decision making will improve, Ability to verbalize feelings will improve, Ability to disclose and discuss suicidal ideas and Ability to identify and develop effective coping behaviors will improve  Medication Management: RN will administer medications as ordered by provider, will assess and evaluate patient's response and provide education to patient for prescribed medication. RN will report any adverse and/or side effects to prescribing provider.  Therapeutic Interventions: 1 on 1 counseling sessions, Psychoeducation, Medication administration, Evaluate responses to treatment, Monitor vital signs and CBGs as ordered, Perform/monitor CIWA, COWS, AIMS and Fall Risk screenings as ordered, Perform wound care treatments as ordered.  Evaluation of Outcomes: Progressing   LCSW Treatment Plan for Primary Diagnosis: <principal problem not specified> Long Term Goal(s): Safe transition to appropriate next level of care at discharge,  Engage patient in therapeutic group addressing interpersonal concerns.  Short Term Goals: Engage patient in aftercare planning with referrals and resources, Increase social support, Increase ability to  appropriately verbalize feelings, Increase emotional regulation, Facilitate patient progression through stages of change regarding substance use diagnoses and concerns and Identify triggers associated with mental health/substance abuse issues  Therapeutic Interventions: Assess for all discharge needs, 1 to 1 time with Social worker, Explore available resources and support systems, Assess for adequacy in community support network, Educate family and significant other(s) on suicide prevention, Complete Psychosocial Assessment, Interpersonal group therapy.  Evaluation of Outcomes: Progressing   Progress in Treatment: Attending groups: No. Participating in groups: No. Taking medication as prescribed: Yes. Toleration medication: Yes. Family/Significant other contact made: No, will contact:  pt declined SPE. Patient understands diagnosis: Yes. Discussing patient identified problems/goals with staff: Yes. Medical problems stabilized or resolved: Yes. Denies suicidal/homicidal ideation: Yes. Issues/concerns per patient self-inventory: No. Other: none  New problem(s) identified: No, Describe:  none  New Short Term/Long Term Goal(s): detox, medication management for mood stabilization; elimination of SI thoughts; development of comprehensive mental wellness/sobriety plan.  Patient Goals:  "get off alcohol and find a way to stay off it.  Get some resources on how to find a job."  Discharge Plan or Barriers: Patient reports plans to being outpatient SA with [facility].  Follow up is scheduled for 05/05/2019 at 8AM.  Reason for Continuation of Hospitalization: Anxiety Depression Medication stabilization Withdrawal symptoms  Estimated Length of Stay: TBD  Recreational Therapy: Patient Stressors: N/A Patient Goal: Patient will engage in groups without prompting or encouragement from LRT x3 group sessions within 5 recreation therapy group sessions  Attendees: Patient: Tyquavion Ciminelli 05/03/2019 3:34 PM   Physician: Dr. Toni Amend, MD 05/03/2019 3:34 PM  Nursing:  05/03/2019 3:34 PM  RN Care Manager: 05/03/2019 3:34 PM  Social Worker: Penni Homans, LCSW 05/03/2019 3:34 PM  Recreational Therapist:  05/03/2019 3:34 PM  Other: Iris Pert, LCSW 05/03/2019 3:34 PM  Other: Lowella Dandy, LCSW 05/03/2019 3:34 PM  Other: 05/03/2019 3:34 PM    Scribe for Treatment Team: Harden Mo, LCSW 05/03/2019 3:34 PM

## 2019-05-03 NOTE — Progress Notes (Signed)
Patient is coherent and alert upon assessment, patient is expressing lack of sleep from previous night, , patient is reassured of a better night sleep with ordered Trazodone 50  Mg. P.O . Patient is compliant and takes his ordered  Medications, patient denies and suicidal , homicidal ideations and denies any depressions or anxiety at this time, there are no signs of hallucination or delusions noted, patient had participated minimally in peer activities with out any issues. Patient appear well rested, mood and affect is appropriate with in this situation. Patient voice no further physical concern. Trazodone was included with the rest of his pills. And precede to bed and only requiring 15 minutes safety checks no distress.

## 2019-05-03 NOTE — [facility] Group Notes (Signed)
Overcoming Obstacles  05/03/2019 1PM  Type of Therapy and Topic:  Group Therapy:  Overcoming Obstacles  Participation Level:  Did Not Attend    Description of Group:    In this group patients will be encouraged to explore what they see as obstacles to their own wellness and recovery. They will be guided to discuss their thoughts, feelings, and behaviors related to these obstacles. The group will process together ways to cope with barriers, with attention given to specific choices patients can make. Each patient will be challenged to identify changes they are motivated to make in order to overcome their obstacles. This group will be process-oriented, with patients participating in exploration of their own experiences as well as giving and receiving support and challenge from other group members.   Therapeutic Goals: 1. Patient will identify personal and current obstacles as they relate to admission. 2. Patient will identify barriers that currently interfere with their wellness or overcoming obstacles.  3. Patient will identify feelings, thought process and behaviors related to these barriers. 4. Patient will identify two changes they are willing to make to overcome these obstacles:      Summary of Patient Progress     Therapeutic Modalities:   Cognitive Behavioral Therapy Solution Focused Therapy Motivational Interviewing Relapse Prevention Therapy    Andre Wagner, MSW, LCSW 05/03/2019 1:59 PM

## 2019-05-03 NOTE — Plan of Care (Signed)
Patient present in the milieu ambulating with a steady gait. Denies having thoughts of self harm, denies depression but endorses some anxiety. Compliant with medications and meals. Minimal interaction with peers, patient mainly stays in her room during the day, Stated that he would go to groups today but also states, "I didn't sleep too good last night, I may just get some rest this morning". Affect is flat but brightens when interacting with this Clinical research associate. Will continue to monitor.

## 2019-05-03 NOTE — [facility] Group Notes (Signed)
[facility] Group Notes:  (Nursing/MHT/Case Management/Adjunct)  Date:  05/03/2019  Time:  9:04 PM  Type of Therapy:  Group Therapy  Participation Level:  Did Not Attend   Mayra Neer 05/03/2019, 9:04 PM

## 2019-05-04 MED ORDER — VENLAFAXINE HCL ER 75 MG PO CP24: 75.0000 mg | ORAL_CAPSULE | Freq: Every day | ORAL | 0 refills | Status: DC

## 2019-05-04 MED ORDER — TRAZODONE HCL 50 MG PO TABS: 50.0000 mg | ORAL_TABLET | Freq: Every evening | ORAL | 0 refills | Status: DC | PRN

## 2019-05-04 NOTE — Progress Notes (Signed)
Recreation Therapy Notes    Date: 05/04/2019   Time: 9:30 am   Location: Craft room   Behavioral response: N/A   Intervention Topic: Self-care   Discussion/Intervention: Patient did not attend group.   Clinical Observations/Feedback:  Patient did not attend group.   Laia Wiley LRT/CTRS        Lindsy Cerullo 05/04/2019 10:30 AM

## 2019-05-04 NOTE — Progress Notes (Signed)
Recreation Therapy Notes  INPATIENT RECREATION THERAPY ASSESSMENT  Patient Details Name: JAVONTAY BASRA MRN: [id number] DOB: [birth year] Today's Date: 05/04/2019       Information Obtained From: Patient  Able to Participate in Assessment/Interview: Yes  Patient Presentation: Responsive  Reason for Admission (Per Patient): Active Symptoms  Patient Stressors:    Coping Skills:   Substance Abuse  Leisure Interests (2+):  (N/A)  Frequency of Recreation/Participation:    Awareness of Community Resources:     [employer]:     Current Use:    If no, Barriers?:    Expressed Interest in [employer] Information:    [place] of Residence:  Film/video editor  Patient Main Form of Transportation:    Patient Strengths:  computers  Patient Identified Areas of Improvement:  Watch what I say  Patient Goal for Hospitalization:  Stop drinking and get out  Current SI (including self-harm):  No  Current HI:  No  Current AVH: No  Staff Intervention Plan: Group Attendance, Collaborate with Interdisciplinary Treatment Team  Consent to Intern Participation: N/A  Carmichael Burdette 05/04/2019, 2:44 PM

## 2019-05-04 NOTE — Progress Notes (Signed)
[facility]  05/04/2019 11:58 AM Andre CanaryMark A Sweetland  MRN:  [id number] Subjective: Follow-up for this patient with depression and alcohol abuse.  Patient seen chart reviewed.  Patient slept slightly better last night but still had a lot of trouble staying asleep.  Feeling a little tired this morning.  Still nervous and anxious and apprehensive about going home.  No side effects from medicine.  No sign of delirium agitation or seizures.  No active suicidal thoughts but some degree of hopelessness. Principal Problem: MDD (major depressive disorder), recurrent severe, without psychosis (HCC) Diagnosis: Principal Problem:   MDD (major depressive disorder), recurrent severe, without psychosis (HCC) Active Problems:   Essential hypertension   Alcohol dependence with uncomplicated withdrawal (HCC)  Total Time spent with patient: 30 minutes  Past Psychiatric History: Patient has a history of alcohol abuse depression and anxiety  Past Medical History:  Past Medical History:  Diagnosis Date  . Chronic pain disorder   . Complication of anesthesia    has woken up during IV anesthesia  . Fatty liver   . GERD (gastroesophageal reflux disease)   . Hypertension   . Panic attack    H/O  . Varicose vein of leg    right    Past Surgical History:  Procedure Laterality Date  . BACK SURGERY    . EVALUATION UNDER ANESTHESIA WITH HEMORRHOIDECTOMY N/A 11/26/2017   Procedure: EXAM UNDER ANESTHESIA WITH HEMORRHOIDECTOMY;  Surgeon: Carolan Shiverintron-Diaz, Edgardo, MD;  Location: [facility] ORS;  Service: General;  Laterality: N/A;  . FACIAL FRACTURE SURGERY Right 03/25/2019   Pt unable to have surgery d/t financial/transportation  . FRACTURE SURGERY    . HARVEST BONE GRAFT     from right hip to repair fracture of the mandible  . KNEE ARTHROSCOPY Right 08/15/2017   Procedure: RIGHT KNEE ARTHROSCOPY  WITH PARTIAL MEDIAL MENISECTOMY, CHONDROPLASTY AND MICROFRACTURE OF LATERAL FEMORAL CONDYLE;  Surgeon: Erin SonsKernodle, Harold,  MD;  [facility];  Service: Orthopedics;  Laterality: Right;  ROB BAGWELL  . MANDIBLE FRACTURE SURGERY Left 2000   metal plate in jaw  . NASAL SINUS SURGERY    . UMBILICAL HERNIA REPAIR N/A 10/25/2016   Procedure: HERNIA REPAIR UMBILICAL ADULT;  Surgeon: Nadeen LandauJarvis Wilton Smith, MD;  Location: [facility] ORS;  Service: General;  Laterality: N/A;   Family History: History reviewed. No pertinent family history. Family Psychiatric  History: None reported Social History:  Social History   Substance and Sexual Activity  Alcohol Use Yes   Comment: ~5th a day     Social History   Substance and Sexual Activity  Drug Use Yes  . Types: Cocaine   Comment: states his gf smokes crack in house, but he doesnt.     Social History   Socioeconomic History  . Marital status: Single    Spouse name: Not on file  . Number of children: Not on file  . Years of education: Not on file  . Highest education level: Not on file  Occupational History  . Occupation: unemployed  Social Needs  . Financial resource strain: Very hard  . Food insecurity:    Worry: Often true    Inability: Often true  . Transportation needs:    Medical: Yes    Non-medical: Yes  Tobacco Use  . Smoking status: Current Every Day Smoker    Packs/day: 1.00    Years: 25.00    Pack years: 25.00    Types: Cigarettes  . Smokeless tobacco: Never Used  Substance and  Sexual Activity  . Alcohol use: Yes    Comment: ~5th a day  . Drug use: Yes    Types: Cocaine    Comment: states his gf smokes crack in house, but he doesnt.   . Sexual activity: Not Currently  Lifestyle  . Physical activity:    Days per week: Not on file    Minutes per session: Not on file  . Stress: Very much  Relationships  . Social connections:    Talks on phone: Not on file    Gets together: Not on file    Attends religious service: Not on file    Active member of club or organization: Not on file    Attends meetings of clubs or  organizations: Not on file    Relationship status: Not on file  Other Topics Concern  . Not on file  Social History Narrative   Pt about to be evicted & unemployed   Additional Social History:                         Sleep: Fair  Appetite:  Fair  Current Medications: Current Facility-Administered Medications  Medication Dose Route Frequency Provider Last Rate Last Dose  . acetaminophen (TYLENOL) tablet 650 mg  650 mg Oral Q6H PRN Mariel Craft, MD   650 mg at 05/02/19 0737  . alum & mag hydroxide-simeth (MAALOX/MYLANTA) 200-200-20 MG/5ML suspension 30 mL  30 mL Oral Q4H PRN Mariel Craft, MD   30 mL at 05/03/19 2141  . amoxicillin-clavulanate (AUGMENTIN) 875-125 MG per tablet 1 tablet  1 tablet Oral BID Mariel Craft, MD   1 tablet at 05/04/19 1008  . diltiazem (CARDIZEM CD) 24 hr capsule 180 mg  180 mg Oral Daily Mariel Craft, MD   180 mg at 05/04/19 1009  . hydrOXYzine (ATARAX/VISTARIL) tablet 25 mg  25 mg Oral Q4H PRN Mariel Craft, MD   25 mg at 05/03/19 2140  . magnesium hydroxide (MILK OF MAGNESIA) suspension 30 mL  30 mL Oral Daily PRN Mariel Craft, MD      . multivitamin with minerals tablet 1 tablet  1 tablet Oral Daily Thalia Party, MD   1 tablet at 05/04/19 0756  . nicotine (NICODERM CQ - dosed in mg/24 hours) patch 21 mg  21 mg Transdermal Daily Mariel Craft, MD   Stopped at 05/01/19 1512  . oxyCODONE-acetaminophen (PERCOCET/ROXICET) 5-325 MG per tablet 1-2 tablet  1-2 tablet Oral Q6H PRN Mariel Craft, MD      . risperiDONE (RISPERDAL M-TABS) disintegrating tablet 2 mg  2 mg Oral Q8H PRN Mariel Craft, MD       And  . ziprasidone (GEODON) injection 20 mg  20 mg Intramuscular PRN Mariel Craft, MD      . sulfamethoxazole-trimethoprim (BACTRIM) 400-80 MG per tablet 1 tablet  1 tablet Oral Q12H Mariel Craft, MD   1 tablet at 05/04/19 1008  . traZODone (DESYREL) tablet 50 mg  50 mg Oral QHS PRN Mariel Craft, MD   50 mg at  05/03/19 2140  . venlafaxine XR (EFFEXOR-XR) 24 hr capsule 75 mg  75 mg Oral Q breakfast Izmael Duross, Jackquline Denmark, MD   75 mg at 05/04/19 0756    Lab Results: No results found for this or any previous visit (from the past 48 hour(s)).  Blood Alcohol level:  Lab Results  Component Value Date   ETH 321 [facility]) 04/29/2019  ETH 152 (H) 04/16/2019    Metabolic Disorder Labs: No results found for: HGBA1C, MPG No results found for: PROLACTIN No results found for: CHOL, TRIG, HDL, CHOLHDL, VLDL, LDLCALC  Physical Findings: AIMS:  , ,  ,  ,    CIWA:  CIWA-Ar Total: 1 COWS:     Musculoskeletal: Strength & Muscle Tone: within normal limits Gait & Station: normal Patient leans: N/A  Psychiatric Specialty Exam: Physical Exam  Nursing note and vitals reviewed. Constitutional: He appears well-developed and well-nourished.  HENT:  Head: Normocephalic and atraumatic.  Eyes: Pupils are equal, round, and reactive to light. Conjunctivae are normal.  Neck: Normal range of motion.  Cardiovascular: Regular rhythm and normal heart sounds.  Respiratory: Effort normal. No respiratory distress.  GI: Soft.  Musculoskeletal: Normal range of motion.  Neurological: He is alert.  Skin: Skin is warm and dry.  Psychiatric: His speech is normal. Judgment normal. His mood appears anxious. He is withdrawn. Cognition and memory are normal. He expresses no suicidal ideation.    Review of Systems  Constitutional: Negative.   HENT: Negative.   Eyes: Negative.   Respiratory: Negative.   Cardiovascular: Negative.   Gastrointestinal: Negative.   Musculoskeletal: Negative.   Skin: Negative.   Neurological: Negative.   Psychiatric/Behavioral: Negative for suicidal ideas. The patient is nervous/anxious.     Blood pressure 118/81, pulse 100, temperature 98.6 F (37 C), temperature source Oral, resp. rate 18, height 5' 6.14" (1.68 m), weight 74.4 kg, SpO2 99 %.Body mass index is 26.36 kg/m.  General Appearance:  Casual  Eye Contact:  Fair  Speech:  Clear and Coherent  Volume:  Decreased  Mood:  Anxious  Affect:  Congruent  Thought Process:  Coherent  Orientation:  Full (Time, Place, and Person)  Thought Content:  Logical  Suicidal Thoughts:  Yes.  without intent/plan  Homicidal Thoughts:  No  Memory:  Immediate;   Fair Recent;   Fair Remote;   Fair  Judgement:  Fair  Insight:  Fair  Psychomotor Activity:  Decreased  Concentration:  Concentration: Fair  Recall:  [employer] of Knowledge:  Fair  Language:  Fair  Akathisia:  No  Handed:  Right  AIMS (if indicated):     Assets:  Communication Skills Desire for Improvement Housing Physical Health  ADL's:  Intact  Cognition:  WNL  Sleep:  Number of Hours: 2.5(Simultaneous filing. User may not have seen previous data.)     Treatment Plan Summary: Daily contact with patient to assess and evaluate symptoms and progress in treatment, Medication management and Plan Patient will stay another day to help with calming his anxiety and increase the chances that he will be able to function outside the hospital.  Tolerating Effexor so far.  No change to current dose.  I have put in a request for a 10-day supply for his medicines at discharge tomorrow at which time he will follow-up with [facility].  Supportive counseling and review of treatment plan with patient.  Mordecai Rasmussen, MD 05/04/2019, 11:58 AM

## 2019-05-04 NOTE — Progress Notes (Signed)
Patient states, "The room has been spinning since taking Effexor this morning. Patient believes he needs a lower dose.

## 2019-05-04 NOTE — BHH Group Notes (Signed)
  LCSW Group Therapy Note  05/04/2019 11:28 AM   Type of Therapy/Topic:  Group Therapy:  Feelings about Diagnosis  Participation Level:  Did Not Attend   Description of Group:   This group will allow patients to explore their thoughts and feelings about diagnoses they have received. Patients will be guided to explore their level of understanding and acceptance of these diagnoses. Facilitator will encourage patients to process their thoughts and feelings about the reactions of others to their diagnosis and will guide patients in identifying ways to discuss their diagnosis with significant others in their lives. This group will be process-oriented, with patients participating in exploration of their own experiences, giving and receiving support, and processing challenge from other group members.   Therapeutic Goals: 1. Patient will demonstrate understanding of diagnosis as evidenced by identifying two or more symptoms of the disorder 2. Patient will be able to express two feelings regarding the diagnosis 3. Patient will demonstrate their ability to communicate their needs through discussion and/or role play  Summary of Patient Progress: x   Therapeutic Modalities:   Cognitive Behavioral Therapy Brief Therapy Feelings Identification    Iris Pert, MSW, LCSW Clinical Social Work 05/04/2019 11:28 AM

## 2019-05-04 NOTE — Plan of Care (Signed)
  Problem: Education: Goal: Knowledge of [facility] Education information/materials will improve Outcome: Progressing Goal: Emotional status will improve Outcome: Progressing Goal: Mental status will improve Outcome: Progressing Goal: Verbalization of understanding the information provided will improve Outcome: Progressing   Problem: Education: Goal: Understanding of discharge needs will improve Outcome: Progressing   Problem: Self-Concept: Goal: Ability to disclose and discuss suicidal ideas will improve Outcome: Progressing Goal: Will verbalize positive feelings about self Outcome: Progressing

## 2019-05-04 NOTE — Plan of Care (Signed)
Patient is alert and oriented X 4, denies SI, HI and AVH. Patient complains of insomnia for several days. Patient blood pressure and antibiotic medications not given at scheduled time due to not being provided by pharmacy at correct time. Message was sent to pharmacy. Patient affect is depressed and flat, but pleasant. Safety checks to continue Q 15 minutes. Problem: Education: Goal: Knowledge of [facility] Education information/materials will improve Outcome: Progressing Goal: Emotional status will improve Outcome: Progressing Goal: Mental status will improve Outcome: Progressing Goal: Verbalization of understanding the information provided will improve Outcome: Progressing   Problem: Education: Goal: Understanding of discharge needs will improve Outcome: Progressing   Problem: Self-Concept: Goal: Ability to disclose and discuss suicidal ideas will improve Outcome: Progressing Goal: Will verbalize positive feelings about self Outcome: Progressing

## 2019-05-05 MED ORDER — DILTIAZEM HCL ER COATED BEADS 180 MG PO CP24: 180.0000 mg | ORAL_CAPSULE | Freq: Every day | ORAL | 0 refills | Status: AC

## 2019-05-05 MED ORDER — VENLAFAXINE HCL ER 75 MG PO CP24: 75.0000 mg | ORAL_CAPSULE | Freq: Every day | ORAL | 1 refills | Status: AC

## 2019-05-05 MED ORDER — TRAZODONE HCL 50 MG PO TABS: 50.0000 mg | ORAL_TABLET | Freq: Every evening | ORAL | 1 refills | Status: AC | PRN

## 2019-05-05 MED ORDER — SULFAMETHOXAZOLE-TRIMETHOPRIM 400-80 MG PO TABS: 1.0000 | ORAL_TABLET | Freq: Two times a day (BID) | ORAL | 0 refills | Status: AC

## 2019-05-05 MED ORDER — SULFAMETHOXAZOLE-TRIMETHOPRIM 400-80 MG PO TABS: 1.0000 | ORAL_TABLET | Freq: Two times a day (BID) | ORAL | 0 refills | Status: DC

## 2019-05-05 MED ORDER — AMOXICILLIN-POT CLAVULANATE 875-125 MG PO TABS: 1.0000 | ORAL_TABLET | Freq: Two times a day (BID) | ORAL | 0 refills | Status: DC

## 2019-05-05 MED ORDER — DILTIAZEM HCL ER COATED BEADS 180 MG PO CP24: 180.0000 mg | ORAL_CAPSULE | Freq: Every day | ORAL | 1 refills | Status: DC

## 2019-05-05 MED ORDER — AMOXICILLIN-POT CLAVULANATE 875-125 MG PO TABS: 1.0000 | ORAL_TABLET | Freq: Two times a day (BID) | ORAL | 0 refills | Status: AC

## 2019-05-05 NOTE — BHH Suicide Risk Assessment (Signed)
[facility] Discharge Suicide Risk Assessment   Principal Problem: MDD (major depressive disorder), recurrent severe, without psychosis (HCC) Discharge Diagnoses: Principal Problem:   MDD (major depressive disorder), recurrent severe, without psychosis (HCC) Active Problems:   Essential hypertension   Alcohol dependence with uncomplicated withdrawal (HCC)   Total Time spent with patient: 45 minutes  Musculoskeletal: Strength & Muscle Tone: within normal limits Gait & Station: normal Patient leans: N/A  Psychiatric Specialty Exam: Review of Systems  Constitutional: Negative.   HENT: Negative.   Eyes: Negative.   Respiratory: Negative.   Cardiovascular: Negative.   Gastrointestinal: Negative.   Musculoskeletal: Negative.   Skin: Negative.   Neurological: Negative.   Psychiatric/Behavioral: Negative.     Blood pressure 110/86, pulse (!) 108, temperature 98.5 F (36.9 C), temperature source Oral, resp. rate 18, height 5' 6.14" (1.68 m), weight 74.4 kg, SpO2 96 %.Body mass index is 26.36 kg/m.  General Appearance: Casual  Eye Contact::  Fair  Speech:  Normal Rate409  Volume:  Normal  Mood:  Euthymic  Affect:  Constricted  Thought Process:  Goal Directed  Orientation:  Full (Time, Place, and Person)  Thought Content:  Logical  Suicidal Thoughts:  No  Homicidal Thoughts:  No  Memory:  Immediate;   Fair Recent;   Fair Remote;   Fair  Judgement:  Fair  Insight:  Fair  Psychomotor Activity:  Normal  Concentration:  Fair  Recall:  [employer] of Knowledge:Fair  Language: Fair  Akathisia:  No  Handed:  Right  AIMS (if indicated):     Assets:  Desire for Improvement Housing  Sleep:  Number of Hours: 8  Cognition: WNL  ADL's:  Intact   Mental Status Per Nursing Assessment::   On Admission:  Suicidal ideation indicated by patient  Demographic Factors:  Male, Caucasian, Living alone and Unemployed  Loss Factors: Loss of significant relationship  Historical  Factors: Impulsivity  Risk Reduction Factors:   Religious beliefs about death and Positive therapeutic relationship  Continued Clinical Symptoms:  Depression:   Comorbid alcohol abuse/dependence Alcohol/Substance Abuse/Dependencies  Cognitive Features That Contribute To Risk:  Polarized thinking    Suicide Risk:  Minimal: No identifiable suicidal ideation.  Patients presenting with no risk factors but with morbid ruminations; may be classified as minimal risk based on the severity of the depressive symptoms  Follow-up Information    [facility] Follow up.   Why:  Please meet with Lorella Nimrod as scheduled for 8AM Contact information: [address] [place] [address] [phone]           Plan Of Care/Follow-up recommendations:  Activity:  Activity as tolerated Diet:  Regular diet Other:  Educated about the possibility of return of infection in the finger.  Continue current medicine follow-up with mental health services at [facility].  Mordecai Rasmussen, MD 05/05/2019, 9:10 AM

## 2019-05-05 NOTE — Progress Notes (Signed)
Recreation Therapy Notes  INPATIENT RECREATION TR PLAN  Patient Details Name: Andre Wagner MRN: [id number] DOB: 10-27-1961 Today's Date: 05/05/2019  Rec Therapy Plan Is patient appropriate for Therapeutic Recreation?: Yes Treatment times per week: at least 3 Estimated Length of Stay: 5-7 days TR Treatment/Interventions: Group participation (Comment)  Discharge Criteria Pt will be discharged from therapy if:: Discharged Treatment plan/goals/alternatives discussed and agreed upon by:: Patient/family  Discharge Summary Short term goals set: Patient will engage in groups without prompting or encouragement from LRT x3 group sessions within 5 recreation therapy group sessions Short term goals met: Adequate for discharge Progress toward goals comments: Groups attended Which groups?: Coping skills Reason goals not met: N/A Therapeutic equipment acquired: N/A Reason patient discharged from therapy: Discharge from hospital Pt/family agrees with progress & goals achieved: Yes Date patient discharged from therapy: 05/05/19   Dailey Alberson 05/05/2019, 1:19 PM

## 2019-05-05 NOTE — Progress Notes (Signed)
Patient was in his all of this shift only was out for his meds and evening snacks , stating that he is tired with low energy and wants to sleep also expressed the need to adjust back to normal routine sleep regimen that he is used to when he was at  Home, patient verbally contract for safety, education and support is provided , 15 minutes safety rounding is in effect no distress noted .

## 2019-05-05 NOTE — Discharge Summary (Signed)
Physician Discharge Summary Note  Patient:  Andre Wagner is an 58 y.o., male MRN:  [id number] DOB:  June 09, 1961 Patient phone:  [phone] (home)  Patient address:   [address] [place] [address],  Total Time spent with patient: 45 minutes  Date of Admission:  05/01/2019 Date of Discharge: May 05, 2019  Reason for Admission: Patient was admitted through the emergency room for alcohol abuse and depression with suicidal ideation  Principal Problem: MDD (major depressive disorder), recurrent severe, without psychosis ([place]) Discharge Diagnoses: Principal Problem:   MDD (major depressive disorder), recurrent severe, without psychosis ([place]) Active Problems:   Essential hypertension   Alcohol dependence with uncomplicated withdrawal ([place])   Past Psychiatric History: History of alcohol abuse depression suicidal thoughts noncompliance.  Past Medical History:  Past Medical History:  Diagnosis Date  . Chronic pain disorder   . Complication of anesthesia    has woken up during IV anesthesia  . Fatty liver   . GERD (gastroesophageal reflux disease)   . Hypertension   . Panic attack    H/O  . Varicose vein of leg    right    Past Surgical History:  Procedure Laterality Date  . BACK SURGERY    . EVALUATION UNDER ANESTHESIA WITH HEMORRHOIDECTOMY N/A 11/26/2017   Procedure: EXAM UNDER ANESTHESIA WITH HEMORRHOIDECTOMY;  Surgeon: Herbert Pun, MD;  Location: [facility];  Service: General;  Laterality: N/A;  . FACIAL FRACTURE SURGERY Right 03/25/2019   Pt unable to have surgery d/t financial/transportation  . FRACTURE SURGERY    . HARVEST BONE GRAFT     from right hip to repair fracture of the mandible  . KNEE ARTHROSCOPY Right 08/15/2017   Procedure: RIGHT KNEE ARTHROSCOPY  WITH PARTIAL MEDIAL MENISECTOMY, CHONDROPLASTY AND MICROFRACTURE OF LATERAL FEMORAL CONDYLE;  Surgeon: Leanor Kail, MD;  Location: [place];  Service: Orthopedics;  Laterality: Right;   ROB BAGWELL  . MANDIBLE FRACTURE SURGERY Left 2000   metal plate in jaw  . NASAL SINUS SURGERY    . UMBILICAL HERNIA REPAIR N/A 10/25/2016   Procedure: HERNIA REPAIR UMBILICAL ADULT;  Surgeon: Leonie Green, MD;  Location: [facility];  Service: General;  Laterality: N/A;   Family History: History reviewed. No pertinent family history. Family Psychiatric  History: Family history of anxiety Social History:  Social History   Substance and Sexual Activity  Alcohol Use Yes   Comment: ~5th a day     Social History   Substance and Sexual Activity  Drug Use Yes  . Types: Cocaine   Comment: states his gf smokes crack in house, but he doesnt.     Social History   Socioeconomic History  . Marital status: Single    Spouse name: Not on file  . Number of children: Not on file  . Years of education: Not on file  . Highest education level: Not on file  Occupational History  . Occupation: unemployed  Social Needs  . Financial resource strain: Very hard  . Food insecurity:    Worry: Often true    Inability: Often true  . Transportation needs:    Medical: Yes    Non-medical: Yes  Tobacco Use  . Smoking status: Current Every Day Smoker    Packs/day: 1.00    Years: 25.00    Pack years: 25.00    Types: Cigarettes  . Smokeless tobacco: Never Used  Substance and Sexual Activity  . Alcohol use: Yes    Comment: ~5th a day  .  Drug use: Yes    Types: Cocaine    Comment: states his gf smokes crack in house, but he doesnt.   . Sexual activity: Not Currently  Lifestyle  . Physical activity:    Days per week: Not on file    Minutes per session: Not on file  . Stress: Very much  Relationships  . Social connections:    Talks on phone: Not on file    Gets together: Not on file    Attends religious service: Not on file    Active member of club or organization: Not on file    Attends meetings of clubs or organizations: Not on file    Relationship status: Not on file  Other Topics  Concern  . Not on file  Social History Narrative   Pt about to be evicted & unemployed    Hospital Course: Patient was admitted to the hospital.  Monitored for alcohol withdrawal symptoms.  Included in individual and group therapy.  Patient did not show any signs of complicated alcohol withdrawal or delirium.  Mood gradually improved.  He was able to talk about his chronic anxiety better.  We discussed possible medication options and he was restarted on low-dose Effexor.  Felt like he was having initially some side effects of dizziness but indicates a willingness to continue trying the medicine to see if he gets used to it.  At no time displayed any suicidal or dangerous behavior.  Met with [facility] representative and agreed to outpatient treatment in the community.  Patient's finger which had been bitten by a dog appears to be getting better.  No sign of worsening.  He has been continued on his antibiotics as well as on blood pressure medicine.  Physical Findings: AIMS:  , ,  ,  ,    CIWA:  CIWA-Ar Total: 1 COWS:     Musculoskeletal: Strength & Muscle Tone: within normal limits Gait & Station: normal Patient leans: N/A  Psychiatric Specialty Exam: Physical Exam  Nursing note and vitals reviewed. Constitutional: He appears well-developed and well-nourished.  HENT:  Head: Normocephalic and atraumatic.  Eyes: Pupils are equal, round, and reactive to light. Conjunctivae are normal.  Neck: Normal range of motion.  Cardiovascular: Regular rhythm and normal heart sounds.  Respiratory: Effort normal. No respiratory distress.  GI: Soft.  Musculoskeletal: Normal range of motion.  Neurological: He is alert.  Skin: Skin is warm and dry.  Psychiatric: He has a normal mood and affect. His speech is normal and behavior is normal. Judgment and thought content normal. Cognition and memory are normal.    Review of Systems  Constitutional: Negative.   HENT: Negative.   Eyes: Negative.   Respiratory:  Negative.   Cardiovascular: Negative.   Gastrointestinal: Negative.   Musculoskeletal: Negative.   Skin: Negative.   Neurological: Negative.   Psychiatric/Behavioral: Negative.     Blood pressure 110/86, pulse (!) 108, temperature 98.5 F (36.9 C), temperature source Oral, resp. rate 18, height 5' 6.14" (1.68 m), weight 74.4 kg, SpO2 96 %.Body mass index is 26.36 kg/m.  General Appearance: Casual  Eye Contact:  Fair  Speech:  Clear and Coherent  Volume:  Normal  Mood:  Euthymic  Affect:  Congruent  Thought Process:  Goal Directed  Orientation:  Full (Time, Place, and Person)  Thought Content:  Logical  Suicidal Thoughts:  No  Homicidal Thoughts:  No  Memory:  Immediate;   Fair Recent;   Fair Remote;   Fair  Judgement:  Fair  Insight:  Fair  Psychomotor Activity:  Decreased  Concentration:  Concentration: Fair  Recall:  [employer] of Knowledge:  Fair  Language:  Fair  Akathisia:  No  Handed:  Right  AIMS (if indicated):     Assets:  Desire for Improvement Housing  ADL's:  Intact  Cognition:  WNL  Sleep:  Number of Hours: 8     Have you used any form of tobacco in the last 30 days? (Cigarettes, Smokeless Tobacco, Cigars, and/or Pipes): Yes  Has this patient used any form of tobacco in the last 30 days? (Cigarettes, Smokeless Tobacco, Cigars, and/or Pipes) Yes, Yes, A prescription for an FDA-approved tobacco cessation medication was offered at discharge and the patient refused  Blood Alcohol level:  Lab Results  Component Value Date   ETH 321 ([place]) 04/29/2019   ETH 152 ([facility]) 19/37/9024    Metabolic Disorder Labs:  No results found for: HGBA1C, MPG No results found for: PROLACTIN No results found for: CHOL, TRIG, HDL, CHOLHDL, VLDL, LDLCALC  See Psychiatric Specialty Exam and Suicide Risk Assessment completed by Attending Physician prior to discharge.  Discharge destination:  Home  Is patient on multiple antipsychotic therapies at discharge:  No   Has Patient  had three or more failed trials of antipsychotic monotherapy by history:  No  Recommended Plan for Multiple Antipsychotic Therapies: NA  Discharge Instructions    Diet - low sodium heart healthy   Complete by:  As directed    Increase activity slowly   Complete by:  As directed      Allergies as of 05/05/2019      Reactions   Codeine Itching   Ok w/ Benadryl      Medication List    STOP taking these medications   amLODipine 5 MG tablet Commonly known as:  NORVASC   famotidine 20 MG tablet Commonly known as:  PEPCID   nicotine 21 mg/24hr patch Commonly known as:  NICODERM CQ - dosed in mg/24 hours   oxyCODONE-acetaminophen 5-325 MG tablet Commonly known as:  PERCOCET/ROXICET     TAKE these medications     Indication  amoxicillin-clavulanate 875-125 MG tablet Commonly known as:  AUGMENTIN Take 1 tablet by mouth 2 (two) times daily for 7 days.  Indication:  Animal Bite Injury   diltiazem 180 MG 24 hr capsule Commonly known as:  CARDIZEM CD Take 1 capsule (180 mg total) by mouth daily.  Indication:  High Blood Pressure Disorder   sulfamethoxazole-trimethoprim 400-80 MG tablet Commonly known as:  BACTRIM Take 1 tablet by mouth every 12 (twelve) hours for 7 days.  Indication:  Infection caused by Bacteria   traZODone 50 MG tablet Commonly known as:  DESYREL Take 1 tablet (50 mg total) by mouth at bedtime as needed for sleep.  Indication:  Trouble Sleeping   venlafaxine XR 75 MG 24 hr capsule Commonly known as:  EFFEXOR-XR Take 1 capsule (75 mg total) by mouth daily with breakfast.  Indication:  Major Depressive Disorder      Follow-up Information    [place] Follow up on 05/06/2019.   Why:  Please meet with Lanae Boast as scheduled for 8AM Contact information: [place] [address] [phone]           Follow-up recommendations:  Activity:  Activity as tolerated Diet:  Regular diet Other:  Follow-up with  [facility].  Comments: 7-day supply of medicines provided as well as prescriptions  Signed: Alethia Berthold,  MD 05/05/2019, 4:30 PM

## 2019-05-05 NOTE — BHH Group Notes (Signed)
LCSW Group Therapy Note  05/05/2019 1:00 PM  Type of Therapy/Topic:  Group Therapy:  Emotion Regulation  Participation Level:  Did Not Attend   Description of Group:   The purpose of this group is to assist patients in learning to regulate negative emotions and experience positive emotions. Patients will be guided to discuss ways in which they have been vulnerable to their negative emotions. These vulnerabilities will be juxtaposed with experiences of positive emotions or situations, and patients will be challenged to use positive emotions to combat negative ones. Special emphasis will be placed on coping with negative emotions in conflict situations, and patients will process healthy conflict resolution skills.  Therapeutic Goals: 1. Patient will identify two positive emotions or experiences to reflect on in order to balance out negative emotions 2. Patient will label two or more emotions that they find the most difficult to experience 3. Patient will demonstrate positive conflict resolution skills through discussion and/or role plays  Summary of Patient Progress:  X  Therapeutic Modalities:   Cognitive Behavioral Therapy Feelings Identification Dialectical Behavioral Therapy  Penni Homans, MSW, LCSW 05/05/2019 12:40 PM

## 2019-05-05 NOTE — Progress Notes (Signed)
Recreation Therapy Notes  Date: 05/05/2019  Time: 9:30 am  Location: Craft room  Behavioral response: Appropriate  Intervention Topic: Coping-Skill  Discussion/Intervention:  Group content on today was focused on coping skills. The group defined what coping skills are and when they can be used. Individuals described how they normally cope with thing and the coping skills they normally use. Patients expressed why it is important to cope with things and how not coping with things can affect you. The group participated in the intervention "My coping box" and made coping boxes while adding coping skills they could use in the future to the box. Clinical Observations/Feedback:  Patient came to group and defined coping skills as ways to deal with emotions. Individual was social with staff while engaging in the intervention. Tiersa Dayley LRT/CTRS         Jessi Jessop 05/05/2019 1:06 PM

## 2019-05-05 NOTE — Progress Notes (Signed)
  [facility] Adult Case Management Discharge Plan :  Will you be returning to the same living situation after discharge:  Yes,  pt is returning home. At discharge, do you have transportation home?: Yes,  CSW will provide a cab voucher.  Do you have the ability to pay for your medications: No.  Release of information consent forms completed and in the chart;  Patient's signature needed at discharge.  Patient to Follow up at: Follow-up Information    [facility] Follow up.   Why:  Please meet with Lorella Nimrod as scheduled for 8AM on 05/06/2019 Contact information: [address] [place] [address] [phone]           Next level of care provider has access to [facility] Link:no  Safety Planning and Suicide Prevention discussed: No. Pt declined [facility] contact.  Have you used any form of tobacco in the last 30 days? (Cigarettes, Smokeless Tobacco, Cigars, and/or Pipes): Yes  Has patient been referred to the Quitline?: Patient refused referral  Patient has been referred for addiction treatment: Pt. refused referral  Harden Mo, LCSW 05/05/2019, 9:32 AM

## 2019-05-05 NOTE — Progress Notes (Deleted)
Recreation Therapy Notes  INPATIENT RECREATION TR PLAN  Patient Details Name: Andre Wagner MRN: [id number] DOB: 17-Apr-1961 Today's Date: 05/05/2019  Rec Therapy Plan Is patient appropriate for Therapeutic Recreation?: Yes Treatment times per week: at least 3 Estimated Length of Stay: 5-7 days TR Treatment/Interventions: Group participation (Comment)  Discharge Criteria Pt will be discharged from therapy if:: Discharged Treatment plan/goals/alternatives discussed and agreed upon by:: Patient/family  Discharge Summary Short term goals set: Patient will engage in groups without prompting or encouragement from LRT x3 group sessions within 5 recreation therapy group sessions Short term goals met: Adequate for discharge Progress toward goals comments: Groups attended Which groups?: Coping skills Reason goals not met: N/A Therapeutic equipment acquired: N/A Reason patient discharged from therapy: Discharge from hospital Pt/family agrees with progress & goals achieved: Yes Date patient discharged from therapy: 05/12/19   Pollyanna Levay 05/05/2019, 1:08 PM

## 2019-05-20 ENCOUNTER — Emergency Department
Admission: EM | Admit: 2019-05-20 | Discharge: 2019-05-21 | Disposition: A | Discharge status: Home / Self Care | Payer: Self-pay | Attending: Emergency Medicine | Admitting: Emergency Medicine

## 2019-05-20 ENCOUNTER — Emergency Department: Payer: Self-pay

## 2019-05-20 ENCOUNTER — Other Ambulatory Visit: Payer: Self-pay

## 2019-05-20 DIAGNOSIS — F10920 Alcohol use, unspecified with intoxication, uncomplicated: Secondary | ICD-10-CM | POA: Insufficient documentation

## 2019-05-20 DIAGNOSIS — F331 Major depressive disorder, recurrent, moderate: Secondary | ICD-10-CM

## 2019-05-20 DIAGNOSIS — Z79899 Other long term (current) drug therapy: Secondary | ICD-10-CM | POA: Insufficient documentation

## 2019-05-20 DIAGNOSIS — R45851 Suicidal ideations: Secondary | ICD-10-CM | POA: Insufficient documentation

## 2019-05-20 DIAGNOSIS — F17210 Nicotine dependence, cigarettes, uncomplicated: Secondary | ICD-10-CM | POA: Insufficient documentation

## 2019-05-20 DIAGNOSIS — F1099 Alcohol use, unspecified with unspecified alcohol-induced disorder: Secondary | ICD-10-CM | POA: Insufficient documentation

## 2019-05-20 DIAGNOSIS — I10 Essential (primary) hypertension: Secondary | ICD-10-CM | POA: Insufficient documentation

## 2019-05-20 LAB — CBC
HCT: 47.6 % (ref 39.0–52.0)
Hemoglobin: 15.4 g/dL (ref 13.0–17.0)
MCH: 30.2 pg (ref 26.0–34.0)
MCHC: 32.4 g/dL (ref 30.0–36.0)
MCV: 93.3 fL (ref 80.0–100.0)
Platelets: 249 10*3/uL (ref 150–400)
RBC: 5.1 MIL/uL (ref 4.22–5.81)
RDW: 14.3 % (ref 11.5–15.5)
WBC: 9.6 10*3/uL (ref 4.0–10.5)
nRBC: 0 % (ref 0.0–0.2)

## 2019-05-20 LAB — COMPREHENSIVE METABOLIC PANEL
ALT: 22 U/L (ref 0–44)
AST: 44 U/L — ABNORMAL HIGH (ref 15–41)
Albumin: 4.1 g/dL (ref 3.5–5.0)
Alkaline Phosphatase: 107 U/L (ref 38–126)
Anion gap: 11 (ref 5–15)
BUN: 7 mg/dL (ref 6–20)
CO2: 25 mmol/L (ref 22–32)
Calcium: 8.8 mg/dL — ABNORMAL LOW (ref 8.9–10.3)
Chloride: 106 mmol/L (ref 98–111)
Creatinine, Ser: 0.49 mg/dL — ABNORMAL LOW (ref 0.61–1.24)
GFR calc Af Amer: >60 mL/min (ref >60)
GFR calc non Af Amer: >60 mL/min (ref >60)
Glucose, Bld: 134 mg/dL — ABNORMAL HIGH (ref 70–99)
Potassium: 3.8 mmol/L (ref 3.5–5.1)
Sodium: 142 mmol/L (ref 135–145)
Total Bilirubin: 0.4 mg/dL (ref 0.3–1.2)
Total Protein: 8.3 g/dL — ABNORMAL HIGH (ref 6.5–8.1)

## 2019-05-20 LAB — SALICYLATE LEVEL: Salicylate Lvl: <7 mg/dL (ref 2.8–30.0)

## 2019-05-20 LAB — ACETAMINOPHEN LEVEL: Acetaminophen (Tylenol), Serum: <10 ug/mL — ABNORMAL LOW (ref 10–30)

## 2019-05-20 LAB — ETHANOL: Alcohol, Ethyl (B): 420 mg/dL (ref <10)

## 2019-05-20 MED ORDER — VITAMIN B-1 100 MG PO TABS
100.0000 mg | ORAL_TABLET | Freq: Every day | ORAL | Status: DC
  Administered 2019-05-21: 100 mg via ORAL
  Filled 2019-05-20: qty 1

## 2019-05-20 MED ORDER — LORAZEPAM 2 MG/ML IJ SOLN: 0.0000 mg | Freq: Four times a day (QID) | INTRAMUSCULAR | Status: DC

## 2019-05-20 MED ORDER — THIAMINE HCL 100 MG/ML IJ SOLN
Freq: Once | INTRAVENOUS | Status: AC
  Administered 2019-05-21: 01:00:00 via INTRAVENOUS
  Filled 2019-05-20: qty 1000

## 2019-05-20 NOTE — ED Notes (Addendum)
Dr Dolores Frame made aware of pt's elevated ETOH level at this time as reported by lab. ETOH 420mg /dL

## 2019-05-20 NOTE — ED Triage Notes (Signed)
Patient c/o SI, reports plan to jump off a bridge.

## 2019-05-20 NOTE — ED Provider Notes (Signed)
[facility] Emergency Department Provider Note   ____________________________________________   First MD Initiated Contact with Patient 05/20/19 2317     (approximate)  I have reviewed the triage vital signs and the nursing notes.   HISTORY  Chief Complaint Suicidal    HPI Andre Wagner is a 58 y.o. male who presents to the ED with a chief complaint of suicidal ideation with plan to jump off a bridge.  Patient has a history of depression, alcohol dependency.  States he just got out of the behavioral health unit.  States he has chronic pain always.  Other than that, voices no medical complaints.       Past Medical History:  Diagnosis Date  . Chronic pain disorder   . Complication of anesthesia    has woken up during IV anesthesia  . Fatty liver   . GERD (gastroesophageal reflux disease)   . Hypertension   . Panic attack    H/O  . Varicose vein of leg    right    Patient Active Problem List   Diagnosis Date Noted  . MDD (major depressive disorder), recurrent severe, without psychosis (HCC) 05/01/2019  . Abrasion of right index finger with infection 04/30/2019  . Suicidal ideations 04/30/2019  . Alcohol dependence with uncomplicated withdrawal (HCC) 04/30/2019  . Major depressive disorder, recurrent, severe without psychotic features (HCC) 04/30/2019  . Compression fracture of L2 vertebra (HCC) 06/02/2018  . Acute post-operative pain 11/26/2017  . Varicose veins of leg with pain, right 04/03/2017  . Chronic venous insufficiency 04/03/2017  . Leg pain 04/03/2017  . Essential hypertension 04/03/2017    Past Surgical History:  Procedure Laterality Date  . BACK SURGERY    . EVALUATION UNDER ANESTHESIA WITH HEMORRHOIDECTOMY N/A 11/26/2017   Procedure: EXAM UNDER ANESTHESIA WITH HEMORRHOIDECTOMY;  Surgeon: Carolan Shiver, MD;  Location: [facility] ORS;  Service: General;  Laterality: N/A;  . FACIAL FRACTURE SURGERY Right 03/25/2019   Pt  unable to have surgery d/t financial/transportation  . FRACTURE SURGERY    . HARVEST BONE GRAFT     from right hip to repair fracture of the mandible  . KNEE ARTHROSCOPY Right 08/15/2017   Procedure: RIGHT KNEE ARTHROSCOPY  WITH PARTIAL MEDIAL MENISECTOMY, CHONDROPLASTY AND MICROFRACTURE OF LATERAL FEMORAL CONDYLE;  Surgeon: Erin Sons, MD;  Location: [facility];  Service: Orthopedics;  Laterality: Right;  ROB BAGWELL  . MANDIBLE FRACTURE SURGERY Left 2000   metal plate in jaw  . NASAL SINUS SURGERY    . UMBILICAL HERNIA REPAIR N/A 10/25/2016   Procedure: HERNIA REPAIR UMBILICAL ADULT;  Surgeon: Nadeen Landau, MD;  Location: [facility] ORS;  Service: General;  Laterality: N/A;    Prior to Admission medications   Medication Sig Start Date End Date Taking? Authorizing Provider  diltiazem (CARDIZEM CD) 180 MG 24 hr capsule Take 1 capsule (180 mg total) by mouth daily. 05/05/19   Clapacs, Jackquline Denmark, MD  traZODone (DESYREL) 50 MG tablet Take 1 tablet (50 mg total) by mouth at bedtime as needed for sleep. 05/05/19   Clapacs, Jackquline Denmark, MD  venlafaxine XR (EFFEXOR-XR) 75 MG 24 hr capsule Take 1 capsule (75 mg total) by mouth daily with breakfast. 05/05/19   Clapacs, Jackquline Denmark, MD    Allergies Codeine  No family history on file.  Social History Social History   Tobacco Use  . Smoking status: Current Every Day Smoker    Packs/day: 1.00    Years: 25.00    Pack  years: 25.00    Types: Cigarettes  . Smokeless tobacco: Never Used  Substance Use Topics  . Alcohol use: Yes    Comment: ~5th a day  . Drug use: Yes    Types: Cocaine    Comment: states his gf smokes crack in house, but he doesnt.     Review of Systems  Constitutional: No fever/chills Eyes: No visual changes. ENT: No sore throat. Cardiovascular: Denies chest pain. Respiratory: Denies shortness of breath. Gastrointestinal: No abdominal pain.  No nausea, no vomiting.  No diarrhea.  No constipation. Genitourinary:  Negative for dysuria. Musculoskeletal: Negative for back pain. Skin: Negative for rash. Neurological: Negative for headaches, focal weakness or numbness. Psychiatric:  Positive for depression with SI.  ____________________________________________   PHYSICAL EXAM:  VITAL SIGNS: ED Triage Vitals  Enc Vitals Group     BP 05/20/19 2233 (!) 142/86     Pulse Rate 05/20/19 2233 95     Resp 05/20/19 2233 18     Temp 05/20/19 2233 97.9 F (36.6 C)     Temp Source 05/20/19 2233 Oral     SpO2 05/20/19 2233 90 %     Weight 05/20/19 2238 164 lb 10.9 oz (74.7 kg)     Height 05/20/19 2238 5\' 6"  (1.676 m)     Head Circumference --      Peak Flow --      Pain Score 05/20/19 2236 8     Pain Loc --      Pain Edu? --      Excl. in GC? --     Constitutional: Alert and oriented.  Disheveled appearing and in no acute distress.  Intoxicated. Eyes: Conjunctivae are normal. PERRL. EOMI. Head: Atraumatic. Nose: No congestion/rhinnorhea. Mouth/Throat: Mucous membranes are moist.  Oropharynx non-erythematous. Neck: No stridor.   Cardiovascular: Normal rate, regular rhythm. Grossly normal heart sounds.  Good peripheral circulation. Respiratory: Normal respiratory effort.  No retractions. Lungs CTAB. Gastrointestinal: Soft and nontender. No distention. No abdominal bruits. No CVA tenderness. Musculoskeletal: No lower extremity tenderness nor edema.  No joint effusions. Neurologic:  Normal speech and language. No gross focal neurologic deficits are appreciated.  Skin:  Skin is warm, dry and intact. No rash noted. Psychiatric: Mood and affect are flat. Speech and behavior are normal.  ____________________________________________   LABS (all labs ordered are listed, but only abnormal results are displayed)  Labs Reviewed  COMPREHENSIVE METABOLIC PANEL - Abnormal; Notable for the following components:      Result Value   Glucose, Bld 134 (*)    Creatinine, Ser 0.49 (*)    Calcium 8.8 (*)     Total Protein 8.3 (*)    AST 44 (*)    All other components within normal limits  ETHANOL - Abnormal; Notable for the following components:   Alcohol, Ethyl (B) 420 (*)    All other components within normal limits  ACETAMINOPHEN LEVEL - Abnormal; Notable for the following components:   Acetaminophen (Tylenol), Serum <10 (*)    All other components within normal limits  SALICYLATE LEVEL  CBC  URINE DRUG SCREEN, QUALITATIVE ([facility] ONLY)   ____________________________________________  EKG  None ____________________________________________  RADIOLOGY  ED MD interpretation: No acute cardiopulmonary process  Official radiology report(s): Dg Chest 2 View  Result Date: 05/21/2019 CLINICAL DATA:  Recent alcohol abuse EXAM: CHEST - 2 VIEW COMPARISON:  07/21/2018 FINDINGS: Cardiac shadows within normal limits. The lungs are clear bilaterally. No focal infiltrate or effusion is seen. No bony abnormality is  noted. IMPRESSION: No acute abnormality noted. Electronically Signed   By: Alcide CleverMark  Lukens M.D.   On: 05/21/2019 00:09    ____________________________________________   PROCEDURES  Procedure(s) performed (including Critical Care):  Procedures   ____________________________________________   INITIAL IMPRESSION / ASSESSMENT AND PLAN / ED COURSE  As part of my medical decision making, I reviewed the following data within the electronic MEDICAL RECORD NUMBER Nursing notes reviewed and incorporated, Labs reviewed, Old chart reviewed, Radiograph reviewed and Notes from prior ED visits     Andre Wagner was evaluated in Emergency Department on 05/21/2019 for the symptoms described in the history of present illness. He was evaluated in the context of the global COVID-19 pandemic, which necessitated consideration that the patient might be at risk for infection with the SARS-CoV-2 virus that causes COVID-19. Institutional protocols and algorithms that pertain to the evaluation of patients at risk  for COVID-19 are in a state of rapid change based on information released by regulatory bodies including the CDC and federal and state organizations. These policies and algorithms were followed during the patient's care in the ED.   58 year old male presenting for depression with suicidal ideation.  Contracts for safety while in the emergency department.  Will administer banana bag and placed on CIWA protocol for intoxication.  Pending psychiatric evaluation and disposition.  Clinical Course as of May 20 624  Fri May 21, 2019  [id number] Patient was too intoxicated to participate in psychiatric interview overnight.  Remains in the ED voluntarily pending psychiatric evaluation and disposition.   [JS]    Clinical Course User Index [JS] Irean HongSung, Jade J, MD     ____________________________________________   FINAL CLINICAL IMPRESSION(S) / ED DIAGNOSES  Final diagnoses:  Moderate episode of recurrent major depressive disorder (HCC)  Alcoholic intoxication without complication [facility](HCC)     ED Discharge Orders    None       Note:  This document was prepared using Dragon voice recognition software and may include unintentional dictation errors.   Irean HongSung, Jade J, MD 05/21/19 [phone]

## 2019-05-20 NOTE — ED Notes (Signed)
This RN and Myah changed patient into hospital provided scrubs. Patient's belonging's placed into labeled bag. Belonging's include:  Keys, $12, pair grey sneakers, pair grey socks, green lounge pants, pack cigarettes, blue/plaid button-up shirt.

## 2019-05-21 LAB — URINE DRUG SCREEN, QUALITATIVE (ARMC ONLY)
Amphetamines, Ur Screen: NOT DETECTED
Barbiturates, Ur Screen: NOT DETECTED
Benzodiazepine, Ur Scrn: POSITIVE — AB
Cannabinoid 50 Ng, Ur ~~LOC~~: NOT DETECTED
Cocaine Metabolite,Ur ~~LOC~~: NOT DETECTED
MDMA (Ecstasy)Ur Screen: NOT DETECTED
Methadone Scn, Ur: NOT DETECTED
Opiate, Ur Screen: NOT DETECTED
Phencyclidine (PCP) Ur S: NOT DETECTED
Tricyclic, Ur Screen: NOT DETECTED

## 2019-05-21 MED ORDER — LORAZEPAM 1 MG PO TABS
1.0000 mg | ORAL_TABLET | Freq: Four times a day (QID) | ORAL | Status: DC | PRN
  Filled 2019-05-21: qty 1

## 2019-05-21 MED ORDER — LORAZEPAM 1 MG PO TABS
1.0000 mg | ORAL_TABLET | Freq: Once | ORAL | Status: AC
  Administered 2019-05-21: 10:00:00 1 mg via ORAL

## 2019-05-21 NOTE — Discharge Instructions (Addendum)
You have been seen in the Emergency Department (ED)  today for a psychiatric complaint.  You have been evaluated by psychiatry and we believe you are safe to be discharged from the hospital.   ° °Please return to the Emergency Department (ED)  immediately if you have ANY thoughts of hurting yourself or anyone else, so that we may help you. ° °Please avoid alcohol and drug use. ° °Follow up with your doctor and/or therapist as soon as possible regarding today's ED  visit.  ° °You may call crisis hotline for [place] at [phone]. ° °

## 2019-05-21 NOTE — ED Notes (Signed)
BEHAVIORAL HEALTH ROUNDING Patient sleeping: No. Patient alert and oriented: yes Behavior appropriate: Yes.  ; If no, describe:  Nutrition and fluids offered: yes Toileting and hygiene offered: Yes  Sitter present: q15 minute observations and security monitoring Law enforcement present: Yes    

## 2019-05-21 NOTE — BH Assessment (Signed)
Assessment Note  Andre Wagner is an 58 y.o. male who presents to the ER intoxicated voicing SI. Upon arrival to the ER patient BAC was 420. He states he was upset because he was depressed about possibly getting evicted. He also states he was "feeling down" about his health. After sharing his health problems, he stated he was improving and excited of the possibility of starting a new job.   During the interview, the patient was calm, cooperative and pleasant. Throughout the interview, the patient denied SI/HI and AV/H. He was able to provide appropriate answers to the questions. He was able to provide protective factors as to why he wouldn't take his life. He stated, "I don't want to die." he also shared was that he loved his family and wouldn't want hurt them.  Writer provided patient was contact information for [facility] and [facility] for his follow up appointment with them.  Diagnosis: Alcohol Use Disorder  Past Medical History:  Past Medical History:  Diagnosis Date  . Chronic pain disorder   . Complication of anesthesia    has woken up during IV anesthesia  . Fatty liver   . GERD (gastroesophageal reflux disease)   . Hypertension   . Panic attack    H/O  . Varicose vein of leg    right    Past Surgical History:  Procedure Laterality Date  . BACK SURGERY    . EVALUATION UNDER ANESTHESIA WITH HEMORRHOIDECTOMY N/A 11/26/2017   Procedure: EXAM UNDER ANESTHESIA WITH HEMORRHOIDECTOMY;  Surgeon: Carolan Shiver, MD;  Location: [facility] ORS;  Service: General;  Laterality: N/A;  . FACIAL FRACTURE SURGERY Right 03/25/2019   Pt unable to have surgery d/t financial/transportation  . FRACTURE SURGERY    . HARVEST BONE GRAFT     from right hip to repair fracture of the mandible  . KNEE ARTHROSCOPY Right 08/15/2017   Procedure: RIGHT KNEE ARTHROSCOPY  WITH PARTIAL MEDIAL MENISECTOMY, CHONDROPLASTY AND MICROFRACTURE OF LATERAL FEMORAL CONDYLE;  Surgeon: Erin Sons, MD;  Location: [facility];  Service: Orthopedics;  Laterality: Right;  ROB BAGWELL  . MANDIBLE FRACTURE SURGERY Left 2000   metal plate in jaw  . NASAL SINUS SURGERY    . UMBILICAL HERNIA REPAIR N/A 10/25/2016   Procedure: HERNIA REPAIR UMBILICAL ADULT;  Surgeon: Nadeen Landau, MD;  Location: [facility] ORS;  Service: General;  Laterality: N/A;    Family History: No family history on file.  Social History:  reports that he has been smoking cigarettes. He has a 25.00 pack-year smoking history. He has never used smokeless tobacco. He reports current alcohol use. He reports current drug use. Drug: Cocaine.  Additional Social History:  Alcohol / Drug Use Pain Medications: See PTA Prescriptions: See PTA Over the Counter: See PTA History of alcohol / drug use?: Yes Longest period of sobriety (when/how long): Unable to quantify Negative Consequences of Use: Personal relationships Withdrawal Symptoms: (n/a) Substance #1 Name of Substance 1: Alcohol 1 - Amount (size/oz): "A fifth" 1 - Last Use / Amount: 05/20/2019  CIWA: CIWA-Ar BP: (!) 146/84 Pulse Rate: 90 Nausea and Vomiting: mild nausea with no vomiting Tactile Disturbances: very mild itching, pins and needles, burning or numbness Tremor: not visible, but can be felt fingertip to fingertip Auditory Disturbances: not present Paroxysmal Sweats: barely perceptible sweating, palms moist Visual Disturbances: very mild sensitivity Anxiety: two Headache, Fullness in Head: very mild Agitation: somewhat more than normal activity Orientation and Clouding of Sensorium: oriented and can do serial additions  CIWA-Ar Total: 9 COWS:    Allergies:  Allergies  Allergen Reactions  . Codeine Itching    Ok w/ Benadryl    Home Medications: (Not in a hospital admission)   OB/GYN Status:  No LMP for male patient.  General Assessment Data [facility] Assessment: In system Is this a Tele or Face-to-Face Assessment?:  Face-to-Face Is this an Initial Assessment or a Re-assessment for this encounter?: Initial Assessment Language Other than English: No Living Arrangements: Other (Comment)(Private Home) What gender do you identify as?: Male Marital status: Single Pregnancy Status: No Living Arrangements: Alone Can pt return to current living arrangement?: Yes Admission Status: Voluntary Is patient capable of signing voluntary admission?: Yes Referral Source: Self/Family/Friend Insurance type: None  Medical Screening Exam [facility] ONLY) Medical Exam completed: Yes  Crisis Care Plan Living Arrangements: Alone Legal Guardian: Other:(Self) Name of Psychiatrist: [facility] Name of Therapist: [facility]  Education Status Is patient currently in school?: No Highest grade of school patient has completed: GED in [employer] Is the patient employed, unemployed or receiving disability?: Unemployed  Risk to self with the past 6 months Suicidal Ideation: No Has patient been a risk to self within the past 6 months prior to admission? : No Suicidal Intent: No Has patient had any suicidal intent within the past 6 months prior to admission? : No Is patient at risk for suicide?: No Suicidal Plan?: No Has patient had any suicidal plan within the past 6 months prior to admission? : No Access to Means: No What has been your use of drugs/alcohol within the last 12 months?: Alcohol Previous Attempts/Gestures: No How many times?: 0 Other Self Harm Risks: Alcohol abuse Triggers for Past Attempts: None known Intentional Self Injurious Behavior: None Family Suicide History: No Recent stressful life event(s): Loss (Comment) Persecutory voices/beliefs?: No Depression: Yes Depression Symptoms: Tearfulness, Isolating, Guilt, Feeling worthless/self pity Substance abuse history and/or treatment for substance abuse?: Yes Suicide prevention information given to non-admitted patients: Not applicable  Risk to Others within the past  6 months Homicidal Ideation: No Does patient have any lifetime risk of violence toward others beyond the six months prior to admission? : No Thoughts of Harm to Others: No Current Homicidal Intent: No Current Homicidal Plan: No Access to Homicidal Means: No Identified Victim: Reports of none History of harm to others?: No Assessment of Violence: None Noted Violent Behavior Description: Reports of none Does patient have access to weapons?: No Criminal Charges Pending?: No Does patient have a court date: No Is patient on probation?: No  Psychosis Hallucinations: None noted Delusions: None noted  Mental Status Report Appearance/Hygiene: Unremarkable, In scrubs Eye Contact: Good Motor Activity: Freedom of movement Speech: Logical/coherent, Unremarkable Level of Consciousness: Alert Mood: Sad, Pleasant Affect: Appropriate to circumstance, Depressed, Sad Anxiety Level: Minimal Thought Processes: Coherent, Relevant Judgement: Unimpaired Orientation: Person, Place, Time, Situation, Appropriate for developmental age Obsessive Compulsive Thoughts/Behaviors: Minimal  Cognitive Functioning Concentration: Normal Memory: Recent Intact, Remote Intact Is patient IDD: No Insight: Fair Impulse Control: Fair Appetite: Good Have you had any weight changes? : No Change Sleep: No Change Total Hours of Sleep: 8 Vegetative Symptoms: None  ADLScreening [facility] Assessment Services) Patient's cognitive ability adequate to safely complete daily activities?: Yes Patient able to express need for assistance with ADLs?: Yes Independently performs ADLs?: Yes (appropriate for developmental age)  Prior Inpatient Therapy Prior Inpatient Therapy: Yes Prior Therapy Dates: 04/2019 [facility] Reason for Treatment: Depression  Prior Outpatient Therapy  Prior Outpatient Therapy: Yes Prior Therapy Dates: Current Prior Therapy Facilty/Provider(s): [facility] Reason for  Treatment: Alcohol use Disorder Does patient have an ACCT team?: No Does patient have Intensive In-House Services?  : No Does patient have [facility] services? : No Does patient have P4CC services?: No  ADL Screening (condition at time of admission) Patient's cognitive ability adequate to safely complete daily activities?: Yes Is the patient deaf or have difficulty hearing?: No Does the patient have difficulty seeing, even when wearing glasses/contacts?: No Does the patient have difficulty concentrating, remembering, or making decisions?: No Patient able to express need for assistance with ADLs?: Yes Does the patient have difficulty dressing or bathing?: No Independently performs ADLs?: Yes (appropriate for developmental age) Does the patient have difficulty walking or climbing stairs?: No Weakness of Legs: None Weakness of Arms/Hands: None  Home Assistive Devices/Equipment Home Assistive Devices/Equipment: None  Therapy Consults (therapy consults require a physician order) PT Evaluation Needed: No OT Evalulation Needed: No SLP Evaluation Needed: No Abuse/Neglect Assessment (Assessment to be complete while patient is alone) Abuse/Neglect Assessment Can Be Completed: Yes Physical Abuse: Denies Verbal Abuse: Denies Sexual Abuse: Denies Exploitation of patient/patient's resources: Denies Self-Neglect: Denies Values / Beliefs Cultural Requests During Hospitalization: None Spiritual Requests During Hospitalization: None Consults Spiritual Care Consult Needed: No Social Work Consult Needed: No [employer]chant [employer] officer (For Healthcare) Does Patient Have a Medical Advance Directive?: No Would patient like information on creating a medical advance directive?: No - Patient declined Nutrition Screen- [facility] Adult/WL/AP Patient's home diet: Regular     Child/Adolescent Assessment Running Away Risk: Denies(Patient is an adult)  Disposition:  Disposition Initial Assessment Completed for this  Encounter: Yes  On Site Evaluation by:   Reviewed with Physician:    Lilyan Gilford MS, LCAS, [facility], N[facility], CCSI Therapeutic Triage Specialist 05/21/2019 11:26 AM

## 2019-05-21 NOTE — ED Notes (Signed)
Patient discharged home, patient received discharge papers. Patient received belongings and verbalized he has received all of his belongings. Patient appropriate and cooperative, Denies SI/HI AVH. Vital signs taken. NAD noted. 

## 2019-05-21 NOTE — ED Notes (Signed)
Pt up to the bathroom with a slow and steady gait. No co's voiced.

## 2019-05-21 NOTE — BH Assessment (Signed)
Still pending TTS pt was too intoxicated to complete assessment upon arrival. ETOH was 420

## 2019-05-21 NOTE — ED Notes (Signed)
Patient talking to the Nurse practitioner

## 2019-05-21 NOTE — ED Provider Notes (Signed)
-----------------------------------------   11:07 AM on 05/21/2019 -----------------------------------------   Blood pressure (!) 155/89, pulse 88, temperature 98.2 F (36.8 C), temperature source Oral, resp. rate 20, height 5\' 6"  (1.676 m), weight 74.7 kg, SpO2 96 %.  Patient has been evaluated by psychiatry and cleared for discharge. IVC lifted by Gillermo Murdoch, NP . Patient's labs have been reviewed with no acute findings. Patient now sober. Patient will be discharged at this time to home    Don Perking, [place], MD 05/21/19 [phone]

## 2019-05-21 NOTE — BH Assessment (Cosign Needed)
Mr.Lira is a 58yo M F with a past psychiatric history of depressive disorder, who was brought to [facility] ED via EMS for suicidal thoughts.The patient was seen face-to-face by this provider; chart reviewed and consulted with Dr. Dolores Frame on 05/21/2019 due to the care of the patient. It was discussed with the provider that the patient is unable to assess due to his alcohol level being 420 mg/dl

## 2019-05-28 ENCOUNTER — Emergency Department: Payer: Self-pay

## 2019-05-28 ENCOUNTER — Emergency Department
Admission: EM | Admit: 2019-05-28 | Discharge: 2019-05-28 | Disposition: A | Discharge status: Home / Self Care | Payer: Self-pay | Attending: Emergency Medicine | Admitting: Emergency Medicine

## 2019-05-28 ENCOUNTER — Other Ambulatory Visit: Payer: Self-pay

## 2019-05-28 DIAGNOSIS — Y908 Blood alcohol level of 240 mg/100 ml or more: Secondary | ICD-10-CM | POA: Insufficient documentation

## 2019-05-28 DIAGNOSIS — F10920 Alcohol use, unspecified with intoxication, uncomplicated: Secondary | ICD-10-CM

## 2019-05-28 DIAGNOSIS — I10 Essential (primary) hypertension: Secondary | ICD-10-CM | POA: Insufficient documentation

## 2019-05-28 DIAGNOSIS — Y929 Unspecified place or not applicable: Secondary | ICD-10-CM | POA: Insufficient documentation

## 2019-05-28 DIAGNOSIS — S0990XA Unspecified injury of head, initial encounter: Secondary | ICD-10-CM | POA: Insufficient documentation

## 2019-05-28 DIAGNOSIS — S00511A Abrasion of lip, initial encounter: Secondary | ICD-10-CM | POA: Insufficient documentation

## 2019-05-28 DIAGNOSIS — S43004A Unspecified dislocation of right shoulder joint, initial encounter: Secondary | ICD-10-CM

## 2019-05-28 DIAGNOSIS — Y939 Activity, unspecified: Secondary | ICD-10-CM | POA: Insufficient documentation

## 2019-05-28 DIAGNOSIS — Y999 Unspecified external cause status: Secondary | ICD-10-CM | POA: Insufficient documentation

## 2019-05-28 DIAGNOSIS — F17210 Nicotine dependence, cigarettes, uncomplicated: Secondary | ICD-10-CM | POA: Insufficient documentation

## 2019-05-28 LAB — CBC WITH DIFFERENTIAL/PLATELET
Abs Immature Granulocytes: 0.02 10*3/uL (ref 0.00–0.07)
Basophils Absolute: 0.1 10*3/uL (ref 0.0–0.1)
Basophils Relative: 2 %
Eosinophils Absolute: 0.3 10*3/uL (ref 0.0–0.5)
Eosinophils Relative: 3 %
HCT: 43.6 % (ref 39.0–52.0)
Hemoglobin: 14.4 g/dL (ref 13.0–17.0)
Immature Granulocytes: 0 %
Lymphocytes Relative: 38 %
Lymphs Abs: 3.3 10*3/uL (ref 0.7–4.0)
MCH: 30.7 pg (ref 26.0–34.0)
MCHC: 33 g/dL (ref 30.0–36.0)
MCV: 93 fL (ref 80.0–100.0)
Monocytes Absolute: 0.7 10*3/uL (ref 0.1–1.0)
Monocytes Relative: 9 %
Neutro Abs: 4.1 10*3/uL (ref 1.7–7.7)
Neutrophils Relative %: 48 %
Platelets: 161 10*3/uL (ref 150–400)
RBC: 4.69 MIL/uL (ref 4.22–5.81)
RDW: 14.6 % (ref 11.5–15.5)
WBC: 8.5 10*3/uL (ref 4.0–10.5)
nRBC: 0 % (ref 0.0–0.2)

## 2019-05-28 LAB — URINE DRUG SCREEN, QUALITATIVE (ARMC ONLY)
Amphetamines, Ur Screen: NOT DETECTED
Barbiturates, Ur Screen: NOT DETECTED
Benzodiazepine, Ur Scrn: POSITIVE — AB
Cannabinoid 50 Ng, Ur ~~LOC~~: NOT DETECTED
Cocaine Metabolite,Ur ~~LOC~~: NOT DETECTED
MDMA (Ecstasy)Ur Screen: NOT DETECTED
Methadone Scn, Ur: NOT DETECTED
Opiate, Ur Screen: NOT DETECTED
Phencyclidine (PCP) Ur S: NOT DETECTED
Tricyclic, Ur Screen: NOT DETECTED

## 2019-05-28 LAB — COMPREHENSIVE METABOLIC PANEL
ALT: 20 U/L (ref 0–44)
AST: 54 U/L — ABNORMAL HIGH (ref 15–41)
Albumin: 4.1 g/dL (ref 3.5–5.0)
Alkaline Phosphatase: 91 U/L (ref 38–126)
Anion gap: 15 (ref 5–15)
BUN: 7 mg/dL (ref 6–20)
CO2: 24 mmol/L (ref 22–32)
Calcium: 8.8 mg/dL — ABNORMAL LOW (ref 8.9–10.3)
Chloride: 101 mmol/L (ref 98–111)
Creatinine, Ser: 0.56 mg/dL — ABNORMAL LOW (ref 0.61–1.24)
GFR calc Af Amer: >60 mL/min (ref >60)
GFR calc non Af Amer: >60 mL/min (ref >60)
Glucose, Bld: 144 mg/dL — ABNORMAL HIGH (ref 70–99)
Potassium: 3.5 mmol/L (ref 3.5–5.1)
Sodium: 140 mmol/L (ref 135–145)
Total Bilirubin: 0.6 mg/dL (ref 0.3–1.2)
Total Protein: 7.6 g/dL (ref 6.5–8.1)

## 2019-05-28 LAB — ETHANOL: Alcohol, Ethyl (B): 359 mg/dL (ref <10)

## 2019-05-28 MED ORDER — MIDAZOLAM HCL 2 MG/2ML IJ SOLN
2.0000 mg | Freq: Once | INTRAMUSCULAR | Status: AC
  Administered 2019-05-28: 2 mg via INTRAVENOUS

## 2019-05-28 MED ORDER — LORAZEPAM 2 MG/ML IJ SOLN
0.0000 mg | Freq: Four times a day (QID) | INTRAMUSCULAR | Status: DC
  Administered 2019-05-28: 2 mg via INTRAVENOUS
  Filled 2019-05-28: qty 1

## 2019-05-28 MED ORDER — OXYCODONE-ACETAMINOPHEN 5-325 MG PO TABS
1.0000 | ORAL_TABLET | Freq: Once | ORAL | Status: AC
  Administered 2019-05-28: 1 via ORAL
  Filled 2019-05-28: qty 1

## 2019-05-28 MED ORDER — SODIUM CHLORIDE 0.9 % IV BOLUS
1000.0000 mL | Freq: Once | INTRAVENOUS | Status: AC
  Administered 2019-05-28: 1000 mL via INTRAVENOUS

## 2019-05-28 MED ORDER — BUPIVACAINE HCL 0.25 % IJ SOLN
5.0000 mL | Freq: Once | INTRAMUSCULAR | Status: AC
  Administered 2019-05-28: 5 mL

## 2019-05-28 MED ORDER — OXYCODONE-ACETAMINOPHEN 5-325 MG PO TABS: 1.0000 | ORAL_TABLET | Freq: Three times a day (TID) | ORAL | 0 refills | Status: AC | PRN

## 2019-05-28 MED ORDER — FENTANYL CITRATE (PF) 100 MCG/2ML IJ SOLN
25.0000 ug | Freq: Once | INTRAMUSCULAR | Status: AC
  Administered 2019-05-28: 25 ug via INTRAVENOUS
  Filled 2019-05-28: qty 2

## 2019-05-28 MED ORDER — THIAMINE HCL 100 MG/ML IJ SOLN
100.0000 mg | Freq: Every day | INTRAMUSCULAR | Status: DC
  Administered 2019-05-28: 100 mg via INTRAVENOUS
  Filled 2019-05-28: qty 2

## 2019-05-28 MED ORDER — MIDAZOLAM HCL 2 MG/2ML IJ SOLN
INTRAMUSCULAR | Status: AC
  Filled 2019-05-28: qty 2

## 2019-05-28 MED ORDER — THIAMINE HCL 100 MG/ML IJ SOLN
Freq: Once | INTRAVENOUS | Status: AC
  Administered 2019-05-28: 04:00:00 via INTRAVENOUS
  Filled 2019-05-28: qty 1000

## 2019-05-28 NOTE — ED Notes (Signed)
Pt waiting out front for mother to pick up
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
## 2019-05-28 NOTE — ED Notes (Signed)
Patient transported to CT 

## 2019-05-28 NOTE — ED Notes (Signed)
Shoulder reduction unsuccessful. Will attempt again.

## 2019-05-28 NOTE — ED Notes (Signed)
Pt given chocolate milk to drink. Wet wipes to wash his hands, a remote control and lights out per request.

## 2019-05-28 NOTE — ED Notes (Signed)
Patient is resting comfortably. 
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
## 2019-05-28 NOTE — ED Notes (Signed)
Versed given twice. Pt was never sedated. Dr Manson Passey and Dr Dolores Frame with pt to reduce shoulder. Reduction successful and shoulder immobolizer applied. Pt c/o pain. Advised pt he would be sore but the pain should improve now that the shoulder is in. Xray order placed.

## 2020-12-08 IMAGING — DX RIGHT INDEX FINGER 2+V
3 series · 3 of 3 positions shown · non-contrast
Comparison: None.

CLINICAL DATA: Dog bite with swelling

EXAM:
RIGHT INDEX FINGER 2+V

[finger ap]
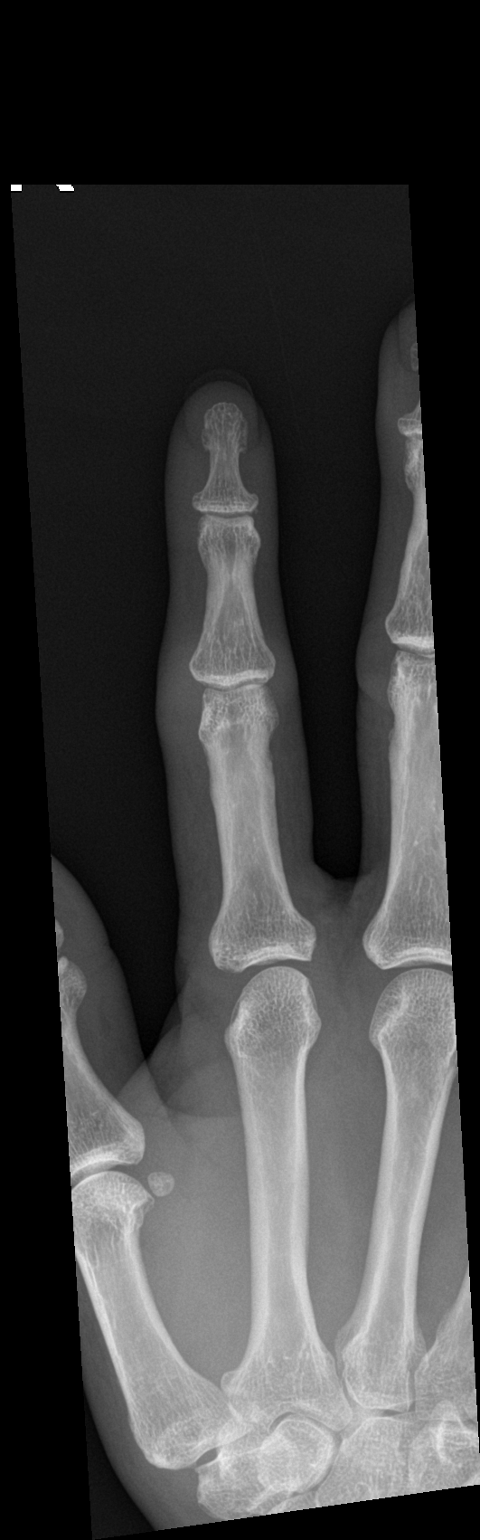

[finger obl]
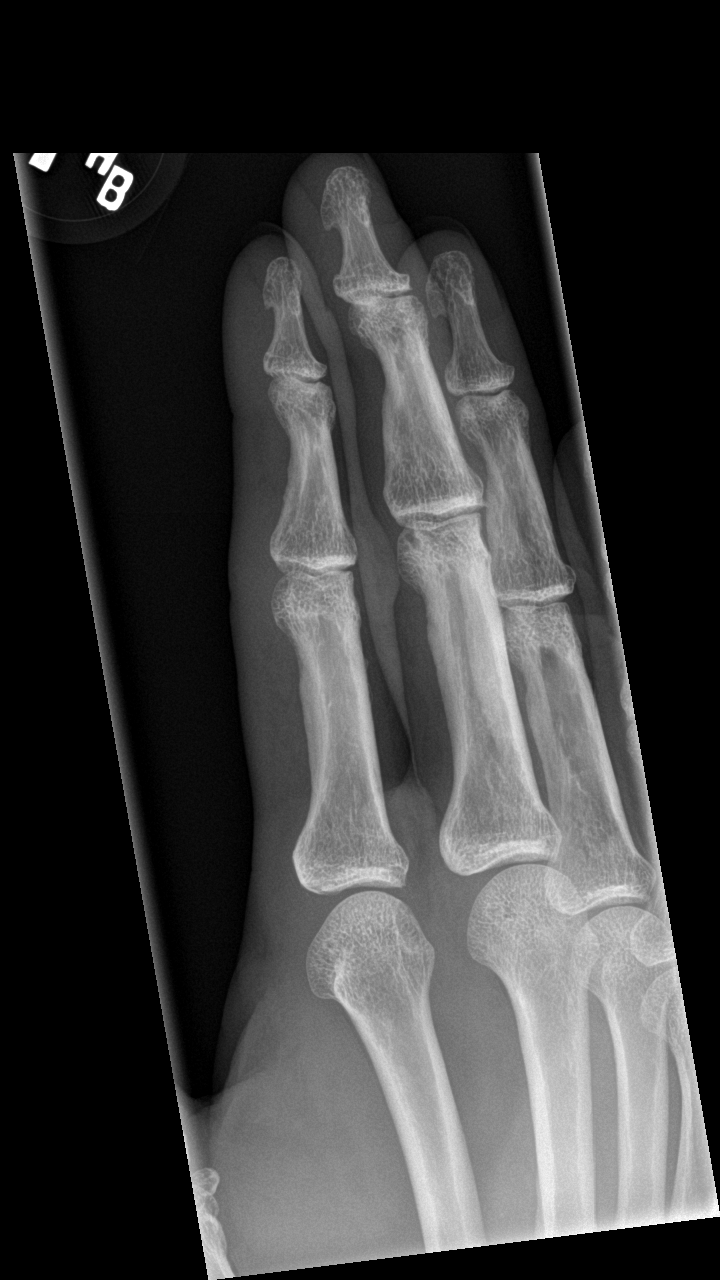

[finger lat]
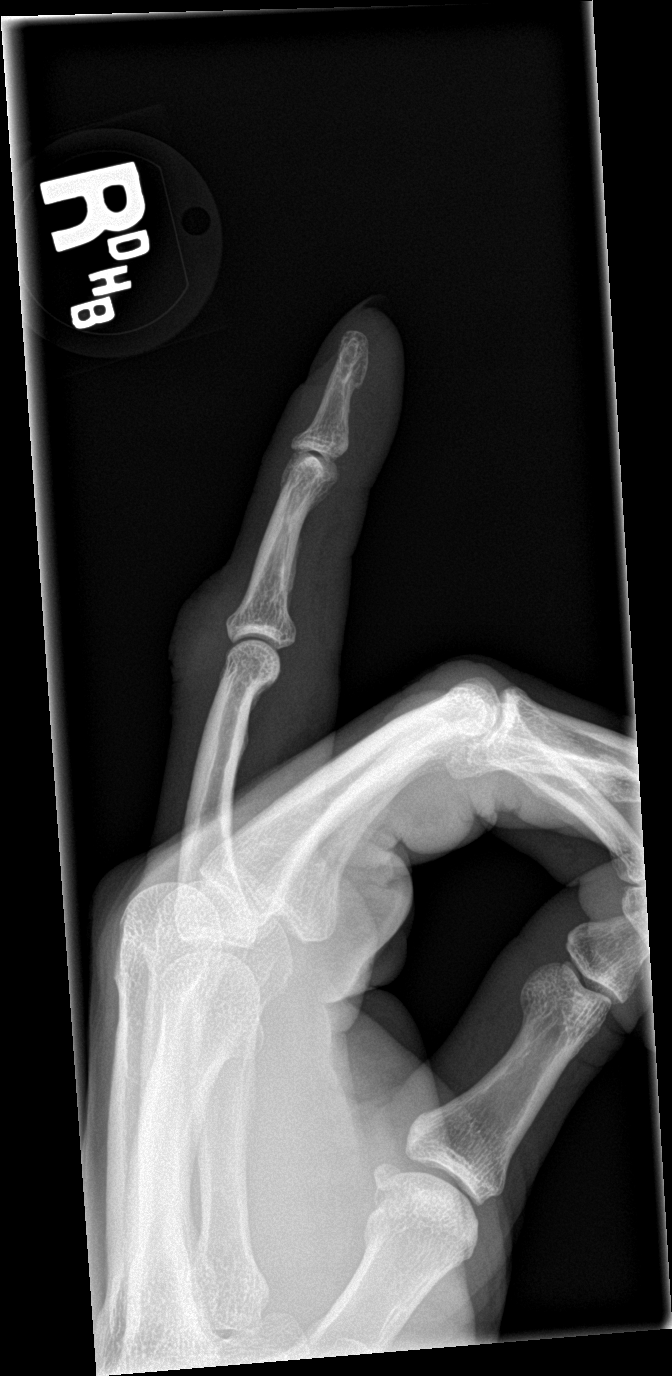

[3 of 3 positions shown; findings below may reference images not displayed]

FINDINGS: No fracture or malalignment. Joint spaces are normal. No radiopaque
foreign body. Soft tissue swelling is present
IMPRESSION: Soft tissue swelling without acute osseous abnormality
